# Patient Record
Sex: Female | Born: 1975 | Race: White | Hispanic: Yes | Marital: Single | Smoking: Never smoker
Health system: Southern US, Community
[De-identification: ages and names within clinical notes are randomized; demographics above are authoritative.]

## PROBLEM LIST (undated history)

## (undated) ENCOUNTER — Inpatient Hospital Stay (HOSPITAL_COMMUNITY): Payer: Self-pay

## (undated) DIAGNOSIS — K8020 Calculus of gallbladder without cholecystitis without obstruction: Secondary | ICD-10-CM

## (undated) DIAGNOSIS — N841 Polyp of cervix uteri: Secondary | ICD-10-CM

---

## 1999-07-14 ENCOUNTER — Emergency Department (HOSPITAL_COMMUNITY): Admission: EM | Admit: 1999-07-14 | Discharge: 1999-07-14 | Payer: Self-pay | Admitting: Internal Medicine

## 1999-07-14 ENCOUNTER — Encounter: Payer: Self-pay | Admitting: Emergency Medicine

## 1999-10-19 ENCOUNTER — Inpatient Hospital Stay (HOSPITAL_COMMUNITY): Admission: AD | Admit: 1999-10-19 | Discharge: 1999-10-22 | Payer: Self-pay | Admitting: *Deleted

## 1999-10-19 ENCOUNTER — Encounter (INDEPENDENT_AMBULATORY_CARE_PROVIDER_SITE_OTHER): Payer: Self-pay | Admitting: Specialist

## 2008-01-12 ENCOUNTER — Inpatient Hospital Stay (HOSPITAL_COMMUNITY): Admission: AD | Admit: 2008-01-12 | Discharge: 2008-01-14 | Payer: Self-pay | Admitting: Obstetrics

## 2008-01-12 ENCOUNTER — Encounter (INDEPENDENT_AMBULATORY_CARE_PROVIDER_SITE_OTHER): Payer: Self-pay | Admitting: Obstetrics

## 2010-07-28 NOTE — H&P (Signed)
NAMEJOSEPHINA, MELCHER             ACCOUNT NO.:  [id number]   MEDICAL RECORD NO.:  [id number]          PATIENT TYPE:  INP   LOCATION:  9164                          FACILITY:  [facility]   PHYSICIAN:  Roseanna Rainbow, M.D.DATE OF BIRTH:  1975-06-21   DATE OF ADMISSION:  01/12/2008  DATE OF DISCHARGE:                              HISTORY & PHYSICAL   CHIEF COMPLAINT:  The patient is a 35 year old para 2 with an estimated  date of confinement of January 07, 2008 with an intrauterine pregnancy  at 40 plus weeks complaining of contractions or rupture of membranes.   HISTORY OF PRESENT ILLNESS:  Please see the above.  The patient reports  rupture of membranes for several hours prior to presentation.   PAST GYN HISTORY:  Normal triad.   ALLERGIES:  No known drug allergies.   SOCIAL HISTORY:  She is married and unemployed.  She denies any tobacco,  ethanol, or drug use.   PAST OBSTETRICAL HISTORY:  In 1998, she was delivered of a live born  female in 8 pounds 3 ounces, full term vaginal delivery, no complications.  In 2001, she was delivered of a live born female 8 pounds, full term  vaginal delivery, no complications.   PAST MEDICAL HISTORY:  She denies.   PAST SURGICAL HISTORY:  She denies.   FAMILY HISTORY:  Noncontributory.   OB RISK FACTORS:  None.   PRENATAL LABS:  Hemoglobin 11.6, hematocrit 36.6, and platelets 238,000.  Blood type A+, antibody screen negative, RPR nonreactive, and rubella  immune.  Hepatitis B surface antigen negative, HIV nonreactive, and PPD  negative.  Pap smear negative.  GC and chlamydia probes negative.  One-  hour GTT 110.  GBS negative on December 13, 2007.  An ultrasound at 20  weeks 5 days, no previa.  Estimated confinement of January 07, 2008.   PHYSICAL EXAMINATION:  Vital signs stable, afebrile.  Fetal heart  tracing reassuring.  Tocodynamometer uterine contractions every 2-4  minutes.  Sterile vaginal exam per the RN.  The cervix is 5 cm  dilated.  There is gross rupture with a meconium-stained fluid.  Ultrasound  confirmed a cephalic presentation.   ASSESSMENT:  Multipara at term, late latent versus early active labor,  and meconium staining.  Fetal heart tracing consistent with fetal well  being.   PLAN:  Admission, expectant management for now.  Anticipate a  spontaneous vaginal delivery.      Roseanna Rainbow, M.D.  Electronically Signed     LAJ/MEDQ  D:  01/12/2008  T:  01/13/2008  Job:  [id number]   cc:   Kathreen Cosier, M.D.  Fax: [phone]

## 2010-12-14 LAB — CBC
HCT: 37
Hemoglobin: 11.8 — ABNORMAL LOW
Hemoglobin: 12.3
MCHC: 33.2
MCV: 92.1
Platelets: 216
RBC: 3.82 — ABNORMAL LOW
RBC: 4.02
RDW: 13.8
WBC: 12.2 — ABNORMAL HIGH
WBC: 9.1

## 2010-12-14 LAB — RPR: RPR Ser Ql: NONREACTIVE

## 2012-03-15 NOTE — L&D Delivery Note (Signed)
Delivery Note At 2:21 PM a viable female was delivered via Vaginal, Spontaneous Delivery (Presentation: ; Occiput Anterior).  APGAR: 9, 9; weight 8 lb 8.7 oz (3875 g).   Placenta status: Intact, Spontaneous.  Cord: 3 vessels with the following complications: None.  Cord pH: none  Anesthesia: None  Episiotomy: None Lacerations: 1st degree;Perineal Suture Repair: none Est. Blood Loss (mL): 250  Mom to postpartum.  Baby to nursery-stable.  HARPER,CHARLES A 10/22/2012, 3:17 PM

## 2012-04-08 ENCOUNTER — Inpatient Hospital Stay (HOSPITAL_COMMUNITY): Payer: Self-pay

## 2012-04-08 ENCOUNTER — Inpatient Hospital Stay (HOSPITAL_COMMUNITY)
Admission: AD | Admit: 2012-04-08 | Discharge: 2012-04-08 | Disposition: A | Discharge status: Home / Self Care | Payer: Self-pay | Source: Ambulatory Visit | Attending: Obstetrics and Gynecology | Admitting: Obstetrics and Gynecology

## 2012-04-08 ENCOUNTER — Encounter (HOSPITAL_COMMUNITY): Payer: Self-pay

## 2012-04-08 DIAGNOSIS — O2390 Unspecified genitourinary tract infection in pregnancy, unspecified trimester: Secondary | ICD-10-CM | POA: Insufficient documentation

## 2012-04-08 DIAGNOSIS — O2340 Unspecified infection of urinary tract in pregnancy, unspecified trimester: Secondary | ICD-10-CM

## 2012-04-08 DIAGNOSIS — M545 Low back pain, unspecified: Secondary | ICD-10-CM | POA: Insufficient documentation

## 2012-04-08 DIAGNOSIS — N390 Urinary tract infection, site not specified: Secondary | ICD-10-CM | POA: Insufficient documentation

## 2012-04-08 DIAGNOSIS — O209 Hemorrhage in early pregnancy, unspecified: Secondary | ICD-10-CM | POA: Insufficient documentation

## 2012-04-08 LAB — URINE MICROSCOPIC-ADD ON

## 2012-04-08 LAB — CBC
HCT: 35.7 % — ABNORMAL LOW (ref 36.0–46.0)
Hemoglobin: 12 g/dL (ref 12.0–15.0)
RDW: 12.5 % (ref 11.5–15.5)
WBC: 9.2 10*3/uL (ref 4.0–10.5)

## 2012-04-08 LAB — URINALYSIS, ROUTINE W REFLEX MICROSCOPIC
Glucose, UA: NEGATIVE mg/dL
Specific Gravity, Urine: >1.03 — ABNORMAL HIGH (ref 1.005–1.030)

## 2012-04-08 LAB — HCG, QUANTITATIVE, PREGNANCY: hCG, Beta Chain, Quant, S: 29715 m[IU]/mL — ABNORMAL HIGH (ref <5)

## 2012-04-08 LAB — POCT PREGNANCY, URINE: Preg Test, Ur: POSITIVE — AB

## 2012-04-08 MED ORDER — GI COCKTAIL ~~LOC~~
30.0000 mL | Freq: Once | ORAL | Status: AC
  Administered 2012-04-08: 30 mL via ORAL
  Filled 2012-04-08: qty 30

## 2012-04-08 MED ORDER — CEPHALEXIN 500 MG PO CAPS: 500.0000 mg | ORAL_CAPSULE | Freq: Three times a day (TID) | ORAL | Status: DC

## 2012-04-08 NOTE — MAU Provider Note (Signed)
History     CSN: [id number]  Arrival date and time: 04/08/12 2034   First Provider Initiated Contact with Patient 04/08/12 2115      Chief Complaint  Patient presents with  . Vaginal Bleeding  . Back Pain   HPI  Pt is a G4P3003 at 14 wks IUP by uncertain LMP.  Reports having lower back pain today and some vaginal bleeding. Experienced similar pain two wks ago .  +dysuria and hematuria. Today have seen some blood when wiping and unsure if coming from vagina.    History reviewed. No pertinent past medical history.  History reviewed. No pertinent past surgical history.  No family history on file.  History  Substance Use Topics  . Smoking status: Never Smoker   . Smokeless tobacco: Not on file  . Alcohol Use: No    Allergies: No Known Allergies  No prescriptions prior to admission    Review of Systems  Constitutional: Negative for fever.  Gastrointestinal: Positive for heartburn, nausea, vomiting and abdominal pain (lower pelvic).  Genitourinary: Positive for dysuria and hematuria. Negative for urgency, frequency and flank pain.       Vaginal bleeding  All other systems reviewed and are negative.   Physical Exam   Blood pressure 128/72, pulse 96, temperature 98.3 F (36.8 C), resp. rate 20, height 5\' 5"  (1.651 m), weight 96.072 kg (211 lb 12.8 oz), last menstrual period 12/29/2011.  Physical Exam  Constitutional: She is oriented to person, place, and time. She appears well-developed and well-nourished. No distress.  HENT:  Head: Normocephalic.  Neck: Normal range of motion. Neck supple.  Cardiovascular: Normal rate, regular rhythm and normal heart sounds.   Respiratory: Effort normal and breath sounds normal. No respiratory distress.  GI: Soft. She exhibits no mass. There is no tenderness. There is no rebound and no guarding.  Genitourinary: Uterus is enlarged. Right adnexum displays no mass, no tenderness and no fullness. Left adnexum displays no mass, no  tenderness and no fullness. No bleeding around the vagina.  Musculoskeletal: Normal range of motion.  Neurological: She is alert and oriented to person, place, and time.  Skin: Skin is warm and dry.    MAU Course  Procedures  Results for orders placed during the hospital encounter of 04/08/12 (from the past 24 hour(s))  URINALYSIS, ROUTINE W REFLEX MICROSCOPIC     Status: Abnormal   Collection Time   04/08/12  9:05 PM      Component Value Range   Color, Urine YELLOW  YELLOW   APPearance CLEAR  CLEAR   Specific Gravity, Urine >1.030 (*) 1.005 - 1.030   pH 5.5  5.0 - 8.0   Glucose, UA NEGATIVE  NEGATIVE mg/dL   Hgb urine dipstick LARGE (*) NEGATIVE   Bilirubin Urine NEGATIVE  NEGATIVE   Ketones, ur 40 (*) NEGATIVE mg/dL   Protein, ur NEGATIVE  NEGATIVE mg/dL   Urobilinogen, UA 0.2  0.0 - 1.0 mg/dL   Nitrite POSITIVE (*) NEGATIVE   Leukocytes, UA NEGATIVE  NEGATIVE  URINE MICROSCOPIC-ADD ON     Status: Abnormal   Collection Time   04/08/12  9:05 PM      Component Value Range   Squamous Epithelial / LPF FEW (*) RARE   WBC, UA 3-6  <3 WBC/hpf   RBC / HPF 21-50  <3 RBC/hpf   Bacteria, UA MANY (*) RARE   Urine-Other MUCOUS PRESENT    POCT PREGNANCY, URINE     Status: Abnormal   Collection Time  04/08/12  9:16 PM      Component Value Range   Preg Test, Ur POSITIVE (*) NEGATIVE  CBC     Status: Abnormal   Collection Time   04/08/12  9:44 PM      Component Value Range   WBC 9.2  4.0 - 10.5 K/uL   RBC 4.00  3.87 - 5.11 MIL/uL   Hemoglobin 12.0  12.0 - 15.0 g/dL   HCT 16.1 (*) 09.6 - 04.5 %   MCV 89.3  78.0 - 100.0 fL   MCH 30.0  26.0 - 34.0 pg   MCHC 33.6  30.0 - 36.0 g/dL   RDW 40.9  81.1 - 91.4 %   Platelets 233  150 - 400 K/uL  HCG, QUANTITATIVE, PREGNANCY     Status: Abnormal   Collection Time   04/08/12  9:44 PM      Component Value Range   hCG, Beta Chain, Quant, [place] [address] (*) <5 mIU/mL  ABO/RH     Status: Normal (Preliminary result)   Collection Time   04/08/12  9:44  PM      Component Value Range   ABO/RH(D) A POS     Ultrasound: IMPRESSION:  1. Single live intrauterine pregnancy noted, with a crown-rump  length of 5.3 cm, corresponding to a gestational age of [redacted] weeks 0  days. This does not match the gestational age of [redacted] weeks 3 days  by LMP, and reflects a new estimated date of delivery of October 21, 2012.  2. Small amount of subchorionic hemorrhage noted.  Assessment and Plan  12 wk IUP - Subchorionic Hemorrhage UTI  Plan: DC to home RX Keflex 500 TID x 7 days Bleeding Precautions Keep scheduled appt with Dr. Gaynell Face on 04/12/12  [facility] 04/08/2012, 9:18 PM

## 2012-04-08 NOTE — MAU Note (Signed)
Having lower back pain today and some vaginal bleeding. Two wks ago some pain when void and saw some blood in urine. Happened again yesterday. Today have seen some blood when wiping and unsure if coming from vagina.

## 2012-04-09 LAB — ABO/RH: ABO/RH(D): A POS

## 2012-04-09 NOTE — MAU Provider Note (Signed)
Attestation of Attending Supervision of Advanced Practitioner (CNM/NP): Evaluation and management procedures were performed by the Advanced Practitioner under my supervision and collaboration.  I have reviewed the Advanced Practitioner's note and chart, and I agree with the management and plan.  Lakendria Nicastro 04/09/2012 1:38 AM

## 2012-04-10 LAB — URINE CULTURE

## 2012-04-11 LAB — GC/CHLAMYDIA PROBE AMP: CT Probe RNA: NEGATIVE

## 2012-04-19 LAB — OB RESULTS CONSOLE RPR: RPR: NONREACTIVE

## 2012-04-19 LAB — OB RESULTS CONSOLE ANTIBODY SCREEN: Antibody Screen: NEGATIVE

## 2012-04-19 LAB — OB RESULTS CONSOLE ABO/RH: RH Type: POSITIVE

## 2012-09-21 LAB — OB RESULTS CONSOLE GC/CHLAMYDIA: Gonorrhea: NEGATIVE

## 2012-10-22 ENCOUNTER — Inpatient Hospital Stay (HOSPITAL_COMMUNITY)
Admission: AD | Admit: 2012-10-22 | Discharge: 2012-10-24 | DRG: 775 | Disposition: A | Discharge status: Home / Self Care | Payer: Medicaid Other | Source: Ambulatory Visit | Attending: Obstetrics | Admitting: Obstetrics

## 2012-10-22 ENCOUNTER — Encounter (HOSPITAL_COMMUNITY): Payer: Self-pay | Admitting: *Deleted

## 2012-10-22 DIAGNOSIS — O09529 Supervision of elderly multigravida, unspecified trimester: Secondary | ICD-10-CM | POA: Diagnosis present

## 2012-10-22 LAB — CBC
Hemoglobin: 11.6 g/dL — ABNORMAL LOW (ref 12.0–15.0)
RBC: 4.05 MIL/uL (ref 3.87–5.11)
WBC: 8.4 10*3/uL (ref 4.0–10.5)

## 2012-10-22 LAB — TYPE AND SCREEN
ABO/RH(D): A POS
Antibody Screen: NEGATIVE

## 2012-10-22 LAB — RPR: RPR Ser Ql: NONREACTIVE

## 2012-10-22 MED ORDER — LACTATED RINGERS IV SOLN: 500.0000 mL | INTRAVENOUS | Status: DC | PRN

## 2012-10-22 MED ORDER — ONDANSETRON HCL 4 MG/2ML IJ SOLN: 4.0000 mg | INTRAMUSCULAR | Status: DC | PRN

## 2012-10-22 MED ORDER — OXYTOCIN 40 UNITS IN LACTATED RINGERS INFUSION - SIMPLE MED: 62.5000 mL/h | INTRAVENOUS | Status: DC | PRN

## 2012-10-22 MED ORDER — TERBUTALINE SULFATE 1 MG/ML IJ SOLN: 0.2500 mg | Freq: Once | INTRAMUSCULAR | Status: DC | PRN

## 2012-10-22 MED ORDER — NALBUPHINE SYRINGE 5 MG/0.5 ML
5.0000 mg | INJECTION | INTRAMUSCULAR | Status: DC | PRN
  Administered 2012-10-22: 5 mg via INTRAVENOUS
  Filled 2012-10-22 (×2): qty 0.5

## 2012-10-22 MED ORDER — SIMETHICONE 80 MG PO CHEW: 80.0000 mg | CHEWABLE_TABLET | ORAL | Status: DC | PRN

## 2012-10-22 MED ORDER — LIDOCAINE HCL (PF) 1 % IJ SOLN
30.0000 mL | INTRAMUSCULAR | Status: DC | PRN
  Filled 2012-10-22: qty 30

## 2012-10-22 MED ORDER — CITRIC ACID-SODIUM CITRATE 334-500 MG/5ML PO SOLN: 30.0000 mL | ORAL | Status: DC | PRN

## 2012-10-22 MED ORDER — ONDANSETRON HCL 4 MG PO TABS: 4.0000 mg | ORAL_TABLET | ORAL | Status: DC | PRN

## 2012-10-22 MED ORDER — IBUPROFEN 600 MG PO TABS: 600.0000 mg | ORAL_TABLET | Freq: Four times a day (QID) | ORAL | Status: DC | PRN

## 2012-10-22 MED ORDER — ERYTHROMYCIN 5 MG/GM OP OINT: TOPICAL_OINTMENT | Freq: Once | OPHTHALMIC | Status: DC

## 2012-10-22 MED ORDER — NALBUPHINE SYRINGE 5 MG/0.5 ML
10.0000 mg | INJECTION | Freq: Four times a day (QID) | INTRAMUSCULAR | Status: DC | PRN
  Administered 2012-10-22: 10 mg via INTRAMUSCULAR
  Filled 2012-10-22 (×2): qty 1

## 2012-10-22 MED ORDER — DIBUCAINE 1 % RE OINT: 1.0000 "application " | TOPICAL_OINTMENT | RECTAL | Status: DC | PRN

## 2012-10-22 MED ORDER — PROMETHAZINE HCL 25 MG/ML IJ SOLN: 25.0000 mg | Freq: Four times a day (QID) | INTRAMUSCULAR | Status: DC | PRN

## 2012-10-22 MED ORDER — ONDANSETRON HCL 4 MG/2ML IJ SOLN
4.0000 mg | Freq: Four times a day (QID) | INTRAMUSCULAR | Status: DC | PRN
  Administered 2012-10-22: 4 mg via INTRAVENOUS
  Filled 2012-10-22: qty 2

## 2012-10-22 MED ORDER — ZOLPIDEM TARTRATE 5 MG PO TABS: 5.0000 mg | ORAL_TABLET | Freq: Every evening | ORAL | Status: DC | PRN

## 2012-10-22 MED ORDER — SENNOSIDES-DOCUSATE SODIUM 8.6-50 MG PO TABS
2.0000 | ORAL_TABLET | Freq: Every day | ORAL | Status: DC
  Administered 2012-10-22 – 2012-10-23 (×2): 2 via ORAL

## 2012-10-22 MED ORDER — LANOLIN HYDROUS EX OINT: TOPICAL_OINTMENT | CUTANEOUS | Status: DC | PRN

## 2012-10-22 MED ORDER — OXYCODONE-ACETAMINOPHEN 5-325 MG PO TABS: 1.0000 | ORAL_TABLET | ORAL | Status: DC | PRN

## 2012-10-22 MED ORDER — IBUPROFEN 600 MG PO TABS
600.0000 mg | ORAL_TABLET | Freq: Four times a day (QID) | ORAL | Status: DC
  Administered 2012-10-22 – 2012-10-24 (×7): 600 mg via ORAL
  Filled 2012-10-22 (×7): qty 1

## 2012-10-22 MED ORDER — LIDOCAINE HCL (PF) 1 % IJ SOLN
INTRAMUSCULAR | Status: AC
  Filled 2012-10-22: qty 30

## 2012-10-22 MED ORDER — BENZOCAINE-MENTHOL 20-0.5 % EX AERO: 1.0000 "application " | INHALATION_SPRAY | CUTANEOUS | Status: DC | PRN

## 2012-10-22 MED ORDER — OXYTOCIN 40 UNITS IN LACTATED RINGERS INFUSION - SIMPLE MED: 62.5000 mL/h | INTRAVENOUS | Status: DC

## 2012-10-22 MED ORDER — OXYTOCIN 10 UNIT/ML IJ SOLN
INTRAMUSCULAR | Status: AC
  Administered 2012-10-22: 10 [IU] via INTRAMUSCULAR
  Filled 2012-10-22: qty 1

## 2012-10-22 MED ORDER — TETANUS-DIPHTH-ACELL PERTUSSIS 5-2.5-18.5 LF-MCG/0.5 IM SUSP
0.5000 mL | Freq: Once | INTRAMUSCULAR | Status: AC
  Administered 2012-10-22: 0.5 mL via INTRAMUSCULAR

## 2012-10-22 MED ORDER — OXYTOCIN 40 UNITS IN LACTATED RINGERS INFUSION - SIMPLE MED
1.0000 m[IU]/min | INTRAVENOUS | Status: DC
  Administered 2012-10-22: 1 m[IU]/min via INTRAVENOUS
  Filled 2012-10-22: qty 1000

## 2012-10-22 MED ORDER — OXYTOCIN 40 UNITS IN LACTATED RINGERS INFUSION - SIMPLE MED
INTRAVENOUS | Status: AC
  Filled 2012-10-22: qty 1000

## 2012-10-22 MED ORDER — PRENATAL MULTIVITAMIN CH
1.0000 | ORAL_TABLET | Freq: Every day | ORAL | Status: DC
  Administered 2012-10-23: 1 via ORAL
  Filled 2012-10-22: qty 1

## 2012-10-22 MED ORDER — OXYTOCIN BOLUS FROM INFUSION: 500.0000 mL | INTRAVENOUS | Status: DC

## 2012-10-22 MED ORDER — OXYTOCIN 10 UNIT/ML IJ SOLN: 10.0000 [IU] | Freq: Once | INTRAMUSCULAR | Status: AC

## 2012-10-22 MED ORDER — DIPHENHYDRAMINE HCL 25 MG PO CAPS: 25.0000 mg | ORAL_CAPSULE | Freq: Four times a day (QID) | ORAL | Status: DC | PRN

## 2012-10-22 MED ORDER — ACETAMINOPHEN 325 MG PO TABS: 650.0000 mg | ORAL_TABLET | ORAL | Status: DC | PRN

## 2012-10-22 MED ORDER — LACTATED RINGERS IV SOLN
INTRAVENOUS | Status: DC
  Administered 2012-10-22: 14:00:00 via INTRAVENOUS

## 2012-10-22 MED ORDER — WITCH HAZEL-GLYCERIN EX PADS: 1.0000 "application " | MEDICATED_PAD | CUTANEOUS | Status: DC | PRN

## 2012-10-22 NOTE — Progress Notes (Signed)
Dr Clearance Coots notified of pt's VE, FHR tracing, ROM, and presentation. Orders received to have midlevel scan pt for presentation.

## 2012-10-22 NOTE — MAU Note (Signed)
Pt reports she has had ctx q 3-5 min Reports some bloody show and good fetal movement.

## 2012-10-22 NOTE — MAU Note (Signed)
D Poe in room with pt, ultrasound shows vertex presentation.

## 2012-10-22 NOTE — H&P (Signed)
Renee Espinoza is a 37 y.o. female presenting for UC's. Maternal Medical History:  Reason for admission: Contractions.  37 yo G4 P3.  EDC 10-21-12   Contractions: Frequency: regular.    Fetal activity: Perceived fetal activity is normal.    Prenatal complications: no prenatal complications Prenatal Complications - Diabetes: none.    OB History   Grav Para Term Preterm Abortions TAB SAB Ect Mult Living   4 3 3       3      History reviewed. No pertinent past medical history. History reviewed. No pertinent past surgical history. Family History: family history is not on file. Social History:  reports that she has never smoked. She does not have any smokeless tobacco history on file. She reports that she does not drink alcohol or use illicit drugs.   Prenatal Transfer Tool  Maternal Diabetes: No Genetic Screening: Normal Maternal Ultrasounds/Referrals: Normal Fetal Ultrasounds or other Referrals:  None Maternal Substance Abuse:  No Significant Maternal Medications:  None Significant Maternal Lab Results:  None Other Comments:  None  Review of Systems  All other systems reviewed and are negative.    Dilation: 4.5 Effacement (%): 80 Station: Ballotable Exam by:: G Morris RN Blood pressure 123/76, pulse 77, temperature 98.7 F (37.1 C), temperature source Oral, resp. rate 18, height 5' 4.5" (1.638 m), weight 222 lb 9.6 oz (100.971 kg), last menstrual period 12/29/2011. Maternal Exam:  Uterine Assessment: Contraction strength is moderate.  Abdomen: Patient reports no abdominal tenderness. Fetal presentation: vertex  Introitus: Normal vulva. Normal vagina.  Amniotic fluid character: clear.  Pelvis: adequate for delivery.   Cervix: Cervix evaluated by sterile speculum exam.     Physical Exam  Nursing note and vitals reviewed. Constitutional: She is oriented to person, place, and time. She appears well-developed and well-nourished.  HENT:  Head: Normocephalic and  atraumatic.  Eyes: Conjunctivae are normal. Pupils are equal, round, and reactive to light.  Neck: Normal range of motion. Neck supple.  Cardiovascular: Normal rate and regular rhythm.   Respiratory: Effort normal and breath sounds normal.  GI: Soft.  Genitourinary: Vagina normal and uterus normal.  Musculoskeletal: Normal range of motion.  Neurological: She is alert and oriented to person, place, and time.  Skin: Skin is warm and dry.  Psychiatric: She has a normal mood and affect. Her behavior is normal. Judgment and thought content normal.    Prenatal labs: ABO, Rh: --/--/A POS (01/25 2144) Antibody:   Rubella:   RPR:    HBsAg:    HIV:    GBS: Negative (07/17 0000)   Assessment/Plan: 40 weeks.  Active labor.  Expectant management.    HARPER,CHARLES A 10/22/2012, 9:55 AM

## 2012-10-23 LAB — CBC
HCT: 29.6 % — ABNORMAL LOW (ref 36.0–46.0)
MCV: 86.5 fL (ref 78.0–100.0)
RDW: 13.9 % (ref 11.5–15.5)
WBC: 10.1 10*3/uL (ref 4.0–10.5)

## 2012-10-23 NOTE — Progress Notes (Signed)
UR completed 

## 2012-10-23 NOTE — Progress Notes (Signed)
Patient ID: Renee Espinoza, female   DOB: [birth year], 37 y.o.   MRN: [id number] Postpartum day one Vital signs normal Fundus firm Lochia moderate Doing well

## 2012-10-24 NOTE — Discharge Summary (Signed)
Obstetric Discharge Summary Reason for Admission: onset of labor Prenatal Procedures: none Intrapartum Procedures: spontaneous vaginal delivery Postpartum Procedures: none Complications-Operative and Postpartum: none Hemoglobin  Date Value Range Status  10/23/2012 9.9* 12.0 - 15.0 g/dL Final     HCT  Date Value Range Status  10/23/2012 29.6* 36.0 - 46.0 % Final    Physical Exam:  General: alert Lochia: appropriate Uterine Fundus: firm Incision: healing well DVT Evaluation: No evidence of DVT seen on physical exam.  Discharge Diagnoses: Term Pregnancy-delivered  Discharge Information: Date: 10/24/2012 Activity: pelvic rest Diet: routine Medications: Percocet Condition: stable Instructions: refer to practice specific booklet Discharge to: home Follow-up Information   Follow up with Karmin Kasprzak A, MD. Schedule an appointment as soon as possible for a visit in 6 weeks.   Contact information:   [address] [place] [address] [phone]       Newborn Data: Live born female  Birth Weight: 8 lb 8.7 oz (3875 g) APGAR: 9, 9  Home with mother.  Renee Espinoza A 10/24/2012, 5:06 AM

## 2014-01-14 ENCOUNTER — Encounter (HOSPITAL_COMMUNITY): Payer: Self-pay | Admitting: *Deleted

## 2014-07-02 ENCOUNTER — Encounter (HOSPITAL_COMMUNITY): Payer: Self-pay | Admitting: Neurology

## 2014-07-02 ENCOUNTER — Emergency Department (HOSPITAL_COMMUNITY): Payer: Medicaid Other

## 2014-07-02 ENCOUNTER — Emergency Department (HOSPITAL_COMMUNITY)
Admission: EM | Admit: 2014-07-02 | Discharge: 2014-07-02 | Disposition: A | Discharge status: Home / Self Care | Payer: Medicaid Other | Attending: Emergency Medicine | Admitting: Emergency Medicine

## 2014-07-02 DIAGNOSIS — R1011 Right upper quadrant pain: Secondary | ICD-10-CM | POA: Diagnosis present

## 2014-07-02 DIAGNOSIS — Z3202 Encounter for pregnancy test, result negative: Secondary | ICD-10-CM | POA: Insufficient documentation

## 2014-07-02 DIAGNOSIS — R112 Nausea with vomiting, unspecified: Secondary | ICD-10-CM

## 2014-07-02 DIAGNOSIS — K8020 Calculus of gallbladder without cholecystitis without obstruction: Secondary | ICD-10-CM | POA: Diagnosis not present

## 2014-07-02 LAB — CBC WITH DIFFERENTIAL/PLATELET
BASOS ABS: 0 10*3/uL (ref 0.0–0.1)
BASOS PCT: 0 % (ref 0–1)
Eosinophils Absolute: 0.1 10*3/uL (ref 0.0–0.7)
Eosinophils Relative: 1 % (ref 0–5)
HEMATOCRIT: 39.5 % (ref 36.0–46.0)
HEMOGLOBIN: 13.1 g/dL (ref 12.0–15.0)
LYMPHS ABS: 2 10*3/uL (ref 0.7–4.0)
Lymphocytes Relative: 15 % (ref 12–46)
MCH: 29.5 pg (ref 26.0–34.0)
MCHC: 33.2 g/dL (ref 30.0–36.0)
MCV: 89 fL (ref 78.0–100.0)
MONO ABS: 0.7 10*3/uL (ref 0.1–1.0)
MONOS PCT: 6 % (ref 3–12)
NEUTROS ABS: 10 10*3/uL — AB (ref 1.7–7.7)
Neutrophils Relative %: 78 % — ABNORMAL HIGH (ref 43–77)
Platelets: 276 10*3/uL (ref 150–400)
RBC: 4.44 MIL/uL (ref 3.87–5.11)
RDW: 12.4 % (ref 11.5–15.5)
WBC: 12.8 10*3/uL — ABNORMAL HIGH (ref 4.0–10.5)

## 2014-07-02 LAB — COMPREHENSIVE METABOLIC PANEL
ALBUMIN: 3.8 g/dL (ref 3.5–5.2)
ALK PHOS: 76 U/L (ref 39–117)
ALT: 70 U/L — AB (ref 0–35)
ANION GAP: 9 (ref 5–15)
AST: 154 U/L — AB (ref 0–37)
BILIRUBIN TOTAL: 1.5 mg/dL — AB (ref 0.3–1.2)
BUN: 7 mg/dL (ref 6–23)
CHLORIDE: 106 mmol/L (ref 96–112)
CO2: 23 mmol/L (ref 19–32)
Calcium: 8.9 mg/dL (ref 8.4–10.5)
Creatinine, Ser: 0.62 mg/dL (ref 0.50–1.10)
GFR calc Af Amer: >90 mL/min (ref >90)
GFR calc non Af Amer: >90 mL/min (ref >90)
Glucose, Bld: 106 mg/dL — ABNORMAL HIGH (ref 70–99)
POTASSIUM: 3.7 mmol/L (ref 3.5–5.1)
SODIUM: 138 mmol/L (ref 135–145)
TOTAL PROTEIN: 7.9 g/dL (ref 6.0–8.3)

## 2014-07-02 LAB — URINALYSIS, ROUTINE W REFLEX MICROSCOPIC
BILIRUBIN URINE: NEGATIVE
Glucose, UA: NEGATIVE mg/dL
HGB URINE DIPSTICK: NEGATIVE
KETONES UR: 15 mg/dL — AB
Leukocytes, UA: NEGATIVE
NITRITE: NEGATIVE
PH: 8.5 — AB (ref 5.0–8.0)
Protein, ur: NEGATIVE mg/dL
SPECIFIC GRAVITY, URINE: 1.021 (ref 1.005–1.030)
Urobilinogen, UA: 1 mg/dL (ref 0.0–1.0)

## 2014-07-02 LAB — URINE MICROSCOPIC-ADD ON

## 2014-07-02 LAB — POC URINE PREG, ED: PREG TEST UR: NEGATIVE

## 2014-07-02 LAB — LIPASE, BLOOD: Lipase: 29 U/L (ref 11–59)

## 2014-07-02 MED ORDER — HYDROCODONE-ACETAMINOPHEN 5-325 MG PO TABS: 1.0000 | ORAL_TABLET | ORAL | Status: DC | PRN

## 2014-07-02 MED ORDER — SODIUM CHLORIDE 0.9 % IV BOLUS (SEPSIS)
1000.0000 mL | Freq: Once | INTRAVENOUS | Status: AC
Start: 2014-07-02 — End: 2014-07-02
  Administered 2014-07-02: 1000 mL via INTRAVENOUS

## 2014-07-02 MED ORDER — ONDANSETRON HCL 4 MG PO TABS: 4.0000 mg | ORAL_TABLET | Freq: Four times a day (QID) | ORAL | Status: DC

## 2014-07-02 MED ORDER — ONDANSETRON HCL 4 MG/2ML IJ SOLN
4.0000 mg | Freq: Once | INTRAMUSCULAR | Status: AC
  Administered 2014-07-02: 4 mg via INTRAVENOUS
  Filled 2014-07-02: qty 2

## 2014-07-02 MED ORDER — MORPHINE SULFATE 4 MG/ML IJ SOLN
4.0000 mg | Freq: Once | INTRAMUSCULAR | Status: AC
  Administered 2014-07-02: 4 mg via INTRAVENOUS
  Filled 2014-07-02: qty 1

## 2014-07-02 NOTE — ED Notes (Signed)
Pt reports pain in her stomach and in her back. Pt states that this pain comes and goes, first starting at 0300 this AM, but has become constant.

## 2014-07-02 NOTE — ED Notes (Signed)
Pt does NOT speak any english. Is here for pain in upper abd and back pain since last night. The pain went away then came back this afternoon. Reports vomiting x 3. Denies urinary problems.

## 2014-07-02 NOTE — ED Provider Notes (Signed)
CSN: [id number]     Arrival date & time 07/02/14  1626 History   First MD Initiated Contact with Patient 07/02/14 1832     Chief Complaint  Patient presents with  . Abdominal Pain     (Consider location/radiation/quality/duration/timing/severity/associated sxs/prior Treatment) Patient is a 39 y.o. female presenting with abdominal pain. The history is provided by the patient. The history is limited by a language barrier. A language interpreter was used.  Abdominal Pain Pain location:  RUQ and epigastric Pain quality comment:  Unable to describe Pain radiates to:  Back Duration:  2 days Timing:  Intermittent Progression:  Waxing and waning Chronicity:  New Context: eating   Context: not diet changes   Ineffective treatments:  Acetaminophen Risk factors: no alcohol abuse, has not had multiple surgeries, no NSAID use, not obese and not pregnant     Renee Espinoza is a 39 y.o. female presenting with right upper quadrant and epigastric abdominal pain that started last night and comes and goes. Patient states she has had 3 episodes of emesis. She has taken Tylenol for her symptoms vomited up. She denies any urinary symptoms.   History reviewed. No pertinent past medical history. History reviewed. No pertinent past surgical history. No family history on file. History  Substance Use Topics  . Smoking status: Never Smoker   . Smokeless tobacco: Never Used  . Alcohol Use: No   OB History    Gravida Para Term Preterm AB TAB SAB Ectopic Multiple Living   Review of Systems  Gastrointestinal: Positive for abdominal pain.      Allergies  Review of patient's allergies indicates no known allergies.  Home Medications   Prior to Admission medications   Medication Sig Start Date End Date Taking? Authorizing Provider  acetaminophen (TYLENOL) 500 MG tablet Take 500 mg by mouth every 6 (six) hours as needed for mild pain.   Yes Historical Provider, MD   HYDROcodone-acetaminophen (NORCO/VICODIN) 5-325 MG per tablet Take 1 tablet by mouth every 4 (four) hours as needed. 07/02/14   Oswaldo Conroy, PA-C  ondansetron (ZOFRAN) 4 MG tablet Take 1 tablet (4 mg total) by mouth every 6 (six) hours. 07/02/14   Oswaldo Conroy, PA-C   BP 107/68 mmHg  Pulse 92  Temp(Src) 97.4 F (36.3 C) (Oral)  Resp 17  SpO2 98%  LMP 07/01/2014 Physical Exam  Constitutional: She appears well-developed and well-nourished. No distress.  HENT:  Head: Normocephalic and atraumatic.  Mouth/Throat: Oropharynx is clear and moist.  Eyes: Conjunctivae and EOM are normal. Right eye exhibits no discharge. Left eye exhibits no discharge.  Cardiovascular: Normal rate and regular rhythm.   Pulmonary/Chest: Effort normal and breath sounds normal. No respiratory distress. She has no wheezes.  Abdominal: Soft. She exhibits no distension.  Hypoactive bowel sounds with right upper quadrant tenderness with positive Murphy's sign. Patient also with epigastric tenderness without rebound, rigidity, guarding. No CVA tenderness.  Neurological: She is alert. She exhibits normal muscle tone. Coordination normal.  Skin: Skin is warm and dry. She is not diaphoretic.  Nursing note and vitals reviewed.   ED Course  Procedures (including critical care time) Labs Review Labs Reviewed  CBC WITH DIFFERENTIAL/PLATELET - Abnormal; Notable for the following:    WBC 12.8 (*)    Neutrophils Relative % 78 (*)    Neutro Abs 10.0 (*)    All other components within normal limits  COMPREHENSIVE METABOLIC PANEL -  Abnormal; Notable for the following:    Glucose, Bld 106 (*)    AST 154 (*)    ALT 70 (*)    Total Bilirubin 1.5 (*)    All other components within normal limits  URINALYSIS, ROUTINE W REFLEX MICROSCOPIC - Abnormal; Notable for the following:    Color, Urine AMBER (*)    APPearance TURBID (*)    pH 8.5 (*)    Ketones, ur 15 (*)    All other components within normal limits  URINE  MICROSCOPIC-ADD ON - Abnormal; Notable for the following:    Squamous Epithelial / LPF FEW (*)    All other components within normal limits  LIPASE, BLOOD  POC URINE PREG, ED    Imaging Review Kore[place] Abdomen Complete  07/02/2014   CLINICAL DATA:  Abdominal pain.  Right upper quadrant tenderness.  EXAM: ULTRASOUND ABDOMEN COMPLETE  COMPARISON:  None.  FINDINGS: Gallbladder: The long dated but not over distended gallbladder containing multiple small mobile stones measuring up to 7 mm in size. Gallbladder wall was not thickened, measuring 2.4 mm. Sonographic Murphy's sign was negative.  Common bile duct: Diameter: 5 mm  Liver: No focal lesion identified. Within normal limits in parenchymal echogenicity.  IVC: No abnormality visualized.  Pancreas: Visualized portion unremarkable.  Spleen: Size and appearance within normal limits.  Right Kidney: Length: 11.4 cm. Echogenicity within normal limits. No mass or hydronephrosis visualized.  Left Kidney: Length: 11.0 cm. Echogenicity within normal limits. No mass or hydronephrosis visualized.  Abdominal aorta: No aneurysm visualized.  Other findings: None.  IMPRESSION: Cholelithiasis without evidence of acute cholecystitis. No biliary dilatation.   Electronically Signed   By: Sebastian AcheAllen  Grady   On: 07/02/2014 21:17     EKG Interpretation None      MDM   Final diagnoses:  Calculus of gallbladder without cholecystitis without obstruction  Non-intractable vomiting with nausea, vomiting of unspecified type   Patient presenting with one-day history of right upper quadrant and epigastric abdominal pain as well as associated emesis. No fevers or chills. VSS. Patient given fluids, Zofran, morphine with significant improvement of her pain. Abdomen tender right upper quadrant without evidence of peritonitis. Lab work significant for elevated liver function and bilirubin and ultrasound without evidence of cholecystitis with evidence of cholelithiasis. On repeat abdominal  exam no tenderness. Patient tolerated fluids in the ED without difficulty. Skin is comfortable though. Driving and sedation precautions provided. She's given referral to wellness center as well as general surgery for further management. Patient nontoxic nonseptic appearing and stable for discharge.  Discussed return precautions with patient. Discussed all results and patient verbalizes understanding and agrees with plan.  Case has been discussed with Dr. Criss AlvineGoldston who agrees with the above plan and to discharge.    Oswaldo ConroyVictoria Mallie Linnemann, PA-C 07/02/14 2237  Pricilla LovelessScott Goldston, MD 07/06/14 [phone]

## 2014-07-02 NOTE — Discharge Instructions (Signed)
Return to the emergency room with worsening of symptoms, new symptoms or with symptoms that are concerning , especially fevers, abdominal pain in one area, unable to keep down fluids, blood in stool or vomit, severe pain, you feel faint, lightheaded or pass out. Read below information and follow recommendations. Colelitiasis (Cholelithiasis) La colelitiasis (tambin llamada clculos en la vescula) es una enfermedad en la que se forman piedras en la vescula. La vescula es un rgano que almacena la bilis que se forma en el hgado y que ayuda a Engineer, agriculturaldigerir grasas. Los clculos comienzan como pequeos cristales y lentamente se transforman en piedras. El dolor en la vescula ocurre cuando se producen espasmos y los clculos obstruyen el conducto. El dolor tambin se produce cuando una piedra sale por el conducto.  FACTORES DE RIESGO  Ser mujer.   Tener embarazos mltiples. Algunas veces los mdicos aconsejan extirpar los clculos biliares antes de futuros embarazos.   Ser obeso.  Dietas que incluyan comidas fritas y grasas.   Ser mayor de 6760 aos y el aumento de la edad.   El uso prolongado de medicamentos que contengan hormonas femeninas.   Tener diabetes mellitus.   Prdida rpida de peso.   Historia familiar de clculos (herencia).  SNTOMAS  Nuseas.   Vmitos.  Dolor abdominal.   Piel amarilla (ictericia)   Dolor sbito. Puede persistir desde algunos minutos hasta algunas horas.  Grant RutsFiebre.   Sensibilidad al tacto. En algunos casos, cuando los clculos biliares no se mueven hacia el conducto biliar, las personas no sienten dolor ni presentan sntomas. Estos se denominan clculos "silenciosos".  TRATAMIENTO Los clculos silenciosos no requieren TEFL teachertratamiento. En los [place] Tool Workscasos graves, podr ser Bangladeshnecesaria una ciruga de urgencia. Las opciones de tratamiento son:  Kandis BanCiruga para extirpar la vescula. Es el tratamiento ms frecuente.  Medicamentos. No siempre dan resultado y  pueden demorar entre 6 y 12 meses o ms en Scientist, water qualityhacer efecto.  Tratamiento con ondas de choque (litotricia biliar extracorporal). En este tratamiento, una mquina de ultrasonido enva ondas de choque a la vescula para destruir los clculos en pequeos fragmentos que luego podrn pasar a los intestinos o ser disueltas con medicamentos. INSTRUCCIONES PARA EL CUIDADO EN EL HOGAR   Slo tome medicamentos de venta libre o recetados para Primary school teachercalmar el dolor, Environmental health practitionerel malestar o bajar la fiebre, segn las indicaciones de su mdico.   Siga una dieta baja en grasas hasta que su mdico lo vea nuevamente. Las grasas hacen que la vescula se Technical sales engineercontraiga, lo que puede Engineer, agriculturalproducir dolor.   Concurra a las consultas de control con su mdico segn las indicaciones. Los ataques casi siempre son recurrentes y generalmente habr que someterse a una ciruga como Williamsontratamiento permanente.  SOLICITE ATENCIN MDICA DE INMEDIATO SI:   El dolor aumenta y no puede controlarlo con los medicamentos.   Tiene fiebre o sntomas persistentes durante ms de 2 - 3 das.   Tiene fiebre y los sntomas empeoran repentinamente.   Tiene nuseas o vmitos persistentes.  ASEGRESE DE QUE:   Comprende estas instrucciones.  Controlar su afeccin.  Recibir ayuda de inmediato si no mejora o si empeora. Document Released: 12/16/2005 Document Revised: 11/01/2012 [facility] LLCExit[facility] Patient Information 2015 [employer], [place]. This information is not intended to replace advice given to you by your health care provider. Make sure you discuss any questions you have with your health care provider.

## 2014-07-03 ENCOUNTER — Inpatient Hospital Stay (HOSPITAL_COMMUNITY)
Admission: EM | Admit: 2014-07-03 | Discharge: 2014-07-06 | DRG: 419 | Disposition: A | Discharge status: Home / Self Care | Payer: Medicaid Other | Attending: General Surgery | Admitting: General Surgery

## 2014-07-03 ENCOUNTER — Encounter (HOSPITAL_COMMUNITY): Payer: Self-pay | Admitting: Emergency Medicine

## 2014-07-03 DIAGNOSIS — K8020 Calculus of gallbladder without cholecystitis without obstruction: Secondary | ICD-10-CM

## 2014-07-03 DIAGNOSIS — R748 Abnormal levels of other serum enzymes: Secondary | ICD-10-CM | POA: Diagnosis present

## 2014-07-03 DIAGNOSIS — K819 Cholecystitis, unspecified: Secondary | ICD-10-CM

## 2014-07-03 DIAGNOSIS — K8001 Calculus of gallbladder with acute cholecystitis with obstruction: Secondary | ICD-10-CM | POA: Diagnosis present

## 2014-07-03 DIAGNOSIS — K8042 Calculus of bile duct with acute cholecystitis without obstruction: Principal | ICD-10-CM | POA: Diagnosis present

## 2014-07-03 HISTORY — DX: Calculus of gallbladder without cholecystitis without obstruction: K80.20

## 2014-07-03 LAB — CBC WITH DIFFERENTIAL/PLATELET
Basophils Absolute: 0 10*3/uL (ref 0.0–0.1)
Basophils Relative: 0 % (ref 0–1)
EOS PCT: 2 % (ref 0–5)
Eosinophils Absolute: 0.2 10*3/uL (ref 0.0–0.7)
HCT: 39.2 % (ref 36.0–46.0)
Hemoglobin: 12.9 g/dL (ref 12.0–15.0)
Lymphocytes Relative: 21 % (ref 12–46)
Lymphs Abs: 1.7 10*3/uL (ref 0.7–4.0)
MCH: 29.5 pg (ref 26.0–34.0)
MCHC: 32.9 g/dL (ref 30.0–36.0)
MCV: 89.5 fL (ref 78.0–100.0)
Monocytes Absolute: 0.4 10*3/uL (ref 0.1–1.0)
Monocytes Relative: 4 % (ref 3–12)
NEUTROS ABS: 6 10*3/uL (ref 1.7–7.7)
NEUTROS PCT: 73 % (ref 43–77)
PLATELETS: 243 10*3/uL (ref 150–400)
RBC: 4.38 MIL/uL (ref 3.87–5.11)
RDW: 12.5 % (ref 11.5–15.5)
WBC: 8.2 10*3/uL (ref 4.0–10.5)

## 2014-07-03 LAB — POC URINE PREG, ED: Preg Test, Ur: NEGATIVE

## 2014-07-03 NOTE — ED Notes (Signed)
Pt took 1 2435m hydrocodone at 8:30 this evening

## 2014-07-03 NOTE — ED Notes (Signed)
Pt. reports persistent upper abdominal pain and mid back pan with emesis , seen here yesterday diagnosed with gallstones discharged home with prescriptions . Denies fever or chills.

## 2014-07-03 NOTE — ED Provider Notes (Signed)
CSN: [id number]     Arrival date & time 07/03/14  2236 History  This chart was scribed for Shon Baton, MD by Annye Asa, ED Scribe. This patient was seen in room A05C/A05C and the patient's care was started at 12:04 AM.    Chief Complaint  Patient presents with  . Cholelithiasis  . Abdominal Pain   Patient is a 39 y.o. female presenting with abdominal pain. The history is provided by the patient and a relative. No language interpreter was used (Adult daughter assisted with translation as needed).  Abdominal Pain Associated symptoms: nausea and vomiting   Associated symptoms: no chest pain, no cough, no diarrhea, no dysuria, no fever and no shortness of breath      HPI Comments: Renee Espinoza is a 39 y.o. female who presents to the Emergency Department complaining of recent RUQ pain, currently rated 5/10, and vomiting. Patient's daughter explains she was seen in [facility] ED last night for the same symptoms and was diagnosed with cholelithiasis; she was discharged home with Norco. Patient states she took the medication as prescribed today with transient relief; per nurse's note, last dose  hydrocodone at 20:30 this evening.. Her pain continued tonight, prompting her to return to the ED. Patient states she has not eaten today; she is unsure if her pain is exacerbated with eating. She denies fevers, diarrhea.   Past Medical History  Diagnosis Date  . Gallstones    History reviewed. No pertinent past surgical history. No family history on file. History  Substance Use Topics  . Smoking status: Never Smoker   . Smokeless tobacco: Never Used  . Alcohol Use: No   OB History    Gravida Para Term Preterm AB TAB SAB Ectopic Multiple Living   Review of Systems  Constitutional: Negative for fever.  Respiratory: Negative for cough, chest tightness and shortness of breath.   Cardiovascular: Negative for chest pain.  Gastrointestinal: Positive for nausea, vomiting and  abdominal pain. Negative for diarrhea.  Genitourinary: Negative for dysuria.  Musculoskeletal: Negative for back pain.  Neurological: Negative for headaches.  Psychiatric/Behavioral: Negative for confusion.  All other systems reviewed and are negative.  Allergies  Review of patient's allergies indicates no known allergies.  Home Medications   Prior to Admission medications   Medication Sig Start Date End Date Taking? Authorizing Provider  acetaminophen (TYLENOL) 500 MG tablet Take 500 mg by mouth every 6 (six) hours as needed for mild pain.    Historical Provider, MD  HYDROcodone-acetaminophen (NORCO/VICODIN) 5-325 MG per tablet Take 1 tablet by mouth every 4 (four) hours as needed. 07/02/14   Oswaldo Conroy, PA-C  ondansetron (ZOFRAN) 4 MG tablet Take 1 tablet (4 mg total) by mouth every 6 (six) hours. 07/02/14   Oswaldo Conroy, PA-C   BP 107/75 mmHg  Pulse 83  Temp(Src) 98.5 F (36.9 C) (Oral)  Resp 16  Ht  (1.651 m)  Wt 220 lb (99.791 kg)  BMI 36.61 kg/m2  SpO2 96%  LMP 06/26/2014 Physical Exam  Constitutional: She is oriented to person, place, and time. She appears well-developed and well-nourished.  Overweight  HENT:  Head: Normocephalic and atraumatic.  Cardiovascular: Normal rate, regular rhythm and normal heart sounds.   No murmur heard. Pulmonary/Chest: Effort normal and breath sounds normal. No respiratory distress. She has no wheezes.  Abdominal: Soft. Bowel sounds are normal. There is tenderness. There is no rebound and no  guarding.  Right upper quadrant tenderness to palpation without rebound or guarding  Neurological: She is alert and oriented to person, place, and time.  Skin: Skin is warm and dry.  Psychiatric: She has a normal mood and affect.  Nursing note and vitals reviewed.   ED Course  Procedures   DIAGNOSTIC STUDIES: Oxygen Saturation is 97% on RA, adequate by my interpretation.    COORDINATION OF CARE: 12:08 AM Discussed treatment plan  with pt at bedside and pt agreed to plan.   Labs Reviewed  URINALYSIS, ROUTINE W REFLEX MICROSCOPIC - Abnormal; Notable for the following:    Color, Urine AMBER (*)    Bilirubin Urine SMALL (*)    Ketones, ur 40 (*)    Leukocytes, UA TRACE (*)    All other components within normal limits  COMPREHENSIVE METABOLIC PANEL - Abnormal; Notable for the following:    BUN <5 (*)    AST 196 (*)    ALT 224 (*)    Total Bilirubin 5.4 (*)    All other components within normal limits  URINE MICROSCOPIC-ADD ON - Abnormal; Notable for the following:    Squamous Epithelial / LPF MANY (*)    Bacteria, UA FEW (*)    All other components within normal limits  CBC WITH DIFFERENTIAL/PLATELET  LIPASE, BLOOD  CBC  CREATININE, SERUM  CBC  COMPREHENSIVE METABOLIC PANEL  POC URINE PREG, ED    Imaging Review [place] Abdomen Complete  07/02/2014   CLINICAL DATA:  Abdominal pain.  Right upper quadrant tenderness.  EXAM: ULTRASOUND ABDOMEN COMPLETE  COMPARISON:  None.  FINDINGS: Gallbladder: The long dated but not over distended gallbladder containing multiple small mobile stones measuring up to 7 mm in size. Gallbladder wall was not thickened, measuring 2.4 mm. Sonographic Murphy's sign was negative.  Common bile duct: Diameter: 5 mm  Liver: No focal lesion identified. Within normal limits in parenchymal echogenicity.  IVC: No abnormality visualized.  Pancreas: Visualized portion unremarkable.  Spleen: Size and appearance within normal limits.  Right Kidney: Length: 11.4 cm. Echogenicity within normal limits. No mass or hydronephrosis visualized.  Left Kidney: Length: 11.0 cm. Echogenicity within normal limits. No mass or hydronephrosis visualized.  Abdominal aorta: No aneurysm visualized.  Other findings: None.  IMPRESSION: Cholelithiasis without evidence of acute cholecystitis. No biliary dilatation.   Electronically Signed   By: Sebastian AcheAllen  Grady   On: 07/02/2014 21:17     EKG Interpretation None      MDM    Final diagnoses:  Cholecystitis   Patient presents with continued right upper quadrant abdominal pain. Diagnosed with cholecystitis yesterday. Nontoxic-appearing on exam. No signs of peritonitis but is tender to palpation of the right upper quadrant. Lab work obtained in triage shows increased LFTs when compared to yesterday. AST is 196, ALT 224, and T bili is now 5.4. Patient made nothing by mouth. Patient given pain and nausea medication. General surgery consulted.  I personally performed the services described in this documentation, which was scribed in my presence. The recorded information has been reviewed and is accurate.      Shon Batonourtney F Shon Mansouri, MD 07/04/14 [phone]

## 2014-07-04 ENCOUNTER — Inpatient Hospital Stay (HOSPITAL_COMMUNITY): Payer: Medicaid Other

## 2014-07-04 DIAGNOSIS — R1011 Right upper quadrant pain: Secondary | ICD-10-CM | POA: Diagnosis present

## 2014-07-04 DIAGNOSIS — R748 Abnormal levels of other serum enzymes: Secondary | ICD-10-CM | POA: Diagnosis present

## 2014-07-04 DIAGNOSIS — K8042 Calculus of bile duct with acute cholecystitis without obstruction: Secondary | ICD-10-CM | POA: Diagnosis present

## 2014-07-04 DIAGNOSIS — K8001 Calculus of gallbladder with acute cholecystitis with obstruction: Secondary | ICD-10-CM | POA: Diagnosis present

## 2014-07-04 LAB — COMPREHENSIVE METABOLIC PANEL
ALT: 224 U/L — ABNORMAL HIGH (ref 0–35)
ALT: 209 U/L — ABNORMAL HIGH (ref 0–35)
ANION GAP: 9 (ref 5–15)
AST: 196 U/L — ABNORMAL HIGH (ref 0–37)
AST: 172 U/L — ABNORMAL HIGH (ref 0–37)
Albumin: 3.8 g/dL (ref 3.5–5.2)
Albumin: 3.5 g/dL (ref 3.5–5.2)
Alkaline Phosphatase: 103 U/L (ref 39–117)
Alkaline Phosphatase: 105 U/L (ref 39–117)
Anion gap: 8 (ref 5–15)
BILIRUBIN TOTAL: 5.4 mg/dL — AB (ref 0.3–1.2)
BILIRUBIN TOTAL: 5.4 mg/dL — AB (ref 0.3–1.2)
CHLORIDE: 105 mmol/L (ref 96–112)
CHLORIDE: 108 mmol/L (ref 96–112)
CO2: 23 mmol/L (ref 19–32)
CO2: 23 mmol/L (ref 19–32)
CREATININE: 0.76 mg/dL (ref 0.50–1.10)
CREATININE: 0.69 mg/dL (ref 0.50–1.10)
Calcium: 8.6 mg/dL (ref 8.4–10.5)
Calcium: 8.4 mg/dL (ref 8.4–10.5)
GFR calc non Af Amer: >90 mL/min (ref >90)
GLUCOSE: 97 mg/dL (ref 70–99)
Glucose, Bld: 107 mg/dL — ABNORMAL HIGH (ref 70–99)
Potassium: 3.7 mmol/L (ref 3.5–5.1)
Potassium: 3.8 mmol/L (ref 3.5–5.1)
SODIUM: 137 mmol/L (ref 135–145)
Sodium: 139 mmol/L (ref 135–145)
TOTAL PROTEIN: 6.4 g/dL (ref 6.0–8.3)
Total Protein: 7.2 g/dL (ref 6.0–8.3)

## 2014-07-04 LAB — CBC
HCT: 38.9 % (ref 36.0–46.0)
HEMOGLOBIN: 12.6 g/dL (ref 12.0–15.0)
MCH: 29.2 pg (ref 26.0–34.0)
MCHC: 32.4 g/dL (ref 30.0–36.0)
MCV: 90 fL (ref 78.0–100.0)
PLATELETS: 230 10*3/uL (ref 150–400)
RBC: 4.32 MIL/uL (ref 3.87–5.11)
RDW: 12.7 % (ref 11.5–15.5)
WBC: 5.9 10*3/uL (ref 4.0–10.5)

## 2014-07-04 LAB — URINALYSIS, ROUTINE W REFLEX MICROSCOPIC
GLUCOSE, UA: NEGATIVE mg/dL
Hgb urine dipstick: NEGATIVE
KETONES UR: 40 mg/dL — AB
NITRITE: NEGATIVE
PH: 6 (ref 5.0–8.0)
PROTEIN: NEGATIVE mg/dL
SPECIFIC GRAVITY, URINE: 1.01 (ref 1.005–1.030)
Urobilinogen, UA: 0.2 mg/dL (ref 0.0–1.0)

## 2014-07-04 LAB — SURGICAL PCR SCREEN
MRSA, PCR: NEGATIVE
Staphylococcus aureus: NEGATIVE

## 2014-07-04 LAB — URINE MICROSCOPIC-ADD ON

## 2014-07-04 LAB — LIPASE, BLOOD: LIPASE: 29 U/L (ref 11–59)

## 2014-07-04 MED ORDER — HYDROMORPHONE HCL 1 MG/ML IJ SOLN
1.0000 mg | INTRAMUSCULAR | Status: DC | PRN
  Administered 2014-07-05 – 2014-07-06 (×4): 1 mg via INTRAVENOUS
  Filled 2014-07-04 (×4): qty 1

## 2014-07-04 MED ORDER — MORPHINE SULFATE 4 MG/ML IJ SOLN
4.0000 mg | Freq: Once | INTRAMUSCULAR | Status: AC
  Administered 2014-07-04: 4 mg via INTRAVENOUS
  Filled 2014-07-04: qty 1

## 2014-07-04 MED ORDER — ONDANSETRON HCL 4 MG/2ML IJ SOLN
4.0000 mg | Freq: Four times a day (QID) | INTRAMUSCULAR | Status: DC | PRN
  Administered 2014-07-04 – 2014-07-06 (×2): 4 mg via INTRAVENOUS
  Filled 2014-07-04 (×2): qty 2

## 2014-07-04 MED ORDER — KCL IN DEXTROSE-NACL 20-5-0.9 MEQ/L-%-% IV SOLN
INTRAVENOUS | Status: DC
  Administered 2014-07-04 – 2014-07-06 (×5): via INTRAVENOUS
  Filled 2014-07-04 (×8): qty 1000

## 2014-07-04 MED ORDER — ONDANSETRON HCL 4 MG/2ML IJ SOLN
4.0000 mg | Freq: Once | INTRAMUSCULAR | Status: AC
  Administered 2014-07-04: 4 mg via INTRAVENOUS
  Filled 2014-07-04: qty 2

## 2014-07-04 MED ORDER — ENOXAPARIN SODIUM 40 MG/0.4ML ~~LOC~~ SOLN
40.0000 mg | Freq: Every day | SUBCUTANEOUS | Status: DC
  Administered 2014-07-04: 40 mg via SUBCUTANEOUS
  Filled 2014-07-04: qty 0.4

## 2014-07-04 NOTE — Progress Notes (Signed)
Patient ID: Renee Espinoza, female   DOB: 04/05/1975, 39 y.o.   MRN: [id number]    Subjective: Pt feels well this morning.  No further pain at this time  Objective: Vital signs in last 24 hours: Temp:  [98.5 F (36.9 C)-98.7 F (37.1 C)] 98.7 F (37.1 C) (04/21 0658) Pulse Rate:  [72-86] 86 (04/21 0658) Resp:  [12-19] 16 (04/21 0658) BP: (100-120)/(63-83) 102/83 mmHg (04/21 0658) SpO2:  [96 %-100 %] 100 % (04/21 0658) Weight:  [99.791 kg (220 lb)] 99.791 kg (220 lb) (04/21 0155) Last BM Date: 07/03/14  Intake/Output from previous day: 04/20 0701 - 04/21 0700 In: 428.3 [I.V.:428.3] Out: -  Intake/Output this shift:    PE: Abd: soft, NT, ND, +BS Heart: regular Lungs: CTAB  Lab Results:   Recent Labs  07/03/14 2252 07/04/14 0724  WBC 8.2 5.9  HGB 12.9 12.6  HCT 39.2 38.9  PLT 243 230   BMET  Recent Labs  07/03/14 2252 07/04/14 0724  NA 137 139  K 3.7 3.8  CL 105 108  CO2 23 23  GLUCOSE 97 107*  BUN <5* <5*  CREATININE 0.76 0.69  CALCIUM 8.6 8.4   PT/INR No results for input(s): LABPROT, INR in the last 72 hours. CMP     Component Value Date/Time   NA 139 07/04/2014 0724   K 3.8 07/04/2014 0724   CL 108 07/04/2014 0724   CO2 23 07/04/2014 0724   GLUCOSE 107* 07/04/2014 0724   BUN <5* 07/04/2014 0724   CREATININE 0.69 07/04/2014 0724   CALCIUM 8.4 07/04/2014 0724   PROT 6.4 07/04/2014 0724   ALBUMIN 3.5 07/04/2014 0724   AST 172* 07/04/2014 0724   ALT 209* 07/04/2014 0724   ALKPHOS 105 07/04/2014 0724   BILITOT 5.4* 07/04/2014 0724   GFRNONAA >90 07/04/2014 0724   GFRAA >90 07/04/2014 0724   Lipase     Component Value Date/Time   LIPASE 29 07/03/2014 2252       Studies/Results: [place] Abdomen Complete  07/02/2014   CLINICAL DATA:  Abdominal pain.  Right upper quadrant tenderness.  EXAM: ULTRASOUND ABDOMEN COMPLETE  COMPARISON:  None.  FINDINGS: Gallbladder: The long dated but not over distended gallbladder containing multiple small mobile  stones measuring up to 7 mm in size. Gallbladder wall was not thickened, measuring 2.4 mm. Sonographic Murphy's sign was negative.  Common bile duct: Diameter: 5 mm  Liver: No focal lesion identified. Within normal limits in parenchymal echogenicity.  IVC: No abnormality visualized.  Pancreas: Visualized portion unremarkable.  Spleen: Size and appearance within normal limits.  Right Kidney: Length: 11.4 cm. Echogenicity within normal limits. No mass or hydronephrosis visualized.  Left Kidney: Length: 11.0 cm. Echogenicity within normal limits. No mass or hydronephrosis visualized.  Abdominal aorta: No aneurysm visualized.  Other findings: None.  IMPRESSION: Cholelithiasis without evidence of acute cholecystitis. No biliary dilatation.   Electronically Signed   By: Sebastian AcheAllen  Grady   On: 07/02/2014 21:17   [place] Abdomen Limited Ruq  07/04/2014   CLINICAL DATA:  Abdominal pain  EXAM: US ABDOMEN LIMITED - RIGHT UPPER QUADRANT  COMPARISON:  07/02/2014  FINDINGS: Gallbladder:  Cholelithiasis. Gallbladder wall thickness at 2.5 mm. No pericholecystic fluid. Negative sonographic Murphy sign.  Common bile duct:  Diameter: 6 mm, within normal limits.  Liver:  Mild diffusely increased in echogenicity. No focal lesion identified.  IMPRESSION: Cholelithiasis without sonographic evidence of acute cholecystitis. Recommend HIDA scan if clinical concern persists.  Mild hepatic steatosis.   Electronically  Signed   By: Jearld Lesch M.D.   On: 07/04/2014 02:33    Anti-infectives: Anti-infectives    None       Assessment/Plan  1. Biliary colic/? Choledocholithiasis, hyperbilirubinemia -TB is still 5.4 today.  She no longer has pain this morning.  Her bili may be plateauing, but will have GI consult and evaluate her need for pre-operative ERCP. -cont NPO for now in case she needs a procedure today -will plan lap chole this admission, timing pending other possible procedures   LOS: 0 days    Briceson Broadwater E 07/04/2014,  9:14 AM Pager: [phone]

## 2014-07-04 NOTE — Consult Note (Signed)
Unassigned Consult  Reason for Consult: Elevated TB and gallstones Referring Physician: CCS  [name] HPI: This is a 39 year old female who represents with RUQ pain.  A repeat ultrasound reveals stones, but no evidence of any cholecystitis.  Her CBD is also measured to be 5-6 mm, however, her liver enzymes are elevated as well as her TB.  Over the past couple of days her liver enzymes appear to be dropping.    Past Medical History  Diagnosis Date  . Gallstones     History reviewed. No pertinent past surgical history.  No family history on file.  Social History:  reports that she has never smoked. She has never used smokeless tobacco. She reports that she does not drink alcohol or use illicit drugs.  Allergies: No Known Allergies  Medications:  Scheduled: . enoxaparin (LOVENOX) injection  40 mg Subcutaneous Daily   Continuous: . dextrose 5 % and 0.9 % NaCl with KCl 20 mEq/L 100 mL/hr at 07/04/14 1421    Results for orders placed or performed during the hospital encounter of 07/03/14 (from the past 24 hour(s))  Urinalysis, Routine w reflex microscopic     Status: Abnormal   Collection Time: 07/03/14 10:52 PM  Result Value Ref Range   Color, Urine AMBER (A) YELLOW   APPearance CLEAR CLEAR   Specific Gravity, Urine 1.010 1.005 - 1.030   pH 6.0 5.0 - 8.0   Glucose, UA NEGATIVE NEGATIVE mg/dL   Hgb urine dipstick NEGATIVE NEGATIVE   Bilirubin Urine SMALL (A) NEGATIVE   Ketones, ur 40 (A) NEGATIVE mg/dL   Protein, ur NEGATIVE NEGATIVE mg/dL   Urobilinogen, UA 0.2 0.0 - 1.0 mg/dL   Nitrite NEGATIVE NEGATIVE   Leukocytes, UA TRACE (A) NEGATIVE  CBC with Differential     Status: None   Collection Time: 07/03/14 10:52 PM  Result Value Ref Range   WBC 8.2 4.0 - 10.5 K/uL   RBC 4.38 3.87 - 5.11 MIL/uL   Hemoglobin 12.9 12.0 - 15.0 g/dL   HCT 16.1 09.6 - 04.5 %   MCV 89.5 78.0 - 100.0 fL   MCH 29.5 26.0 - 34.0 pg   MCHC 32.9 30.0 - 36.0 g/dL   RDW 40.9 81.1 - 91.4 %   Platelets 243 150 - 400 K/uL   Neutrophils Relative % 73 43 - 77 %   Neutro Abs 6.0 1.7 - 7.7 K/uL   Lymphocytes Relative 21 12 - 46 %   Lymphs Abs 1.7 0.7 - 4.0 K/uL   Monocytes Relative 4 3 - 12 %   Monocytes Absolute 0.4 0.1 - 1.0 K/uL   Eosinophils Relative 2 0 - 5 %   Eosinophils Absolute 0.2 0.0 - 0.7 K/uL   Basophils Relative 0 0 - 1 %   Basophils Absolute 0.0 0.0 - 0.1 K/uL  Comprehensive metabolic panel     Status: Abnormal   Collection Time: 07/03/14 10:52 PM  Result Value Ref Range   Sodium 137 135 - 145 mmol/L   Potassium 3.7 3.5 - 5.1 mmol/L   Chloride 105 96 - 112 mmol/L   CO2 23 19 - 32 mmol/L   Glucose, Bld 97 70 - 99 mg/dL   BUN <5 (L) 6 - 23 mg/dL   Creatinine, Ser 7.82 0.50 - 1.10 mg/dL   Calcium 8.6 8.4 - 95.6 mg/dL   Total Protein 7.2 6.0 - 8.3 g/dL   Albumin 3.8 3.5 - 5.2 g/dL   AST 213 (H) 0 - 37 U/L  ALT 224 (H) 0 - 35 U/L   Alkaline Phosphatase 103 39 - 117 U/L   Total Bilirubin 5.4 (H) 0.3 - 1.2 mg/dL   GFR calc non Af Amer >90 >90 mL/min   GFR calc Af Amer >90 >90 mL/min   Anion gap 9 5 - 15  Lipase, blood     Status: None   Collection Time: 07/03/14 10:52 PM  Result Value Ref Range   Lipase 29 11 - 59 U/L  Urine microscopic-add on     Status: Abnormal   Collection Time: 07/03/14 10:52 PM  Result Value Ref Range   Squamous Epithelial / LPF MANY (A) RARE   WBC, UA 3-6 <3 WBC/hpf   Bacteria, UA FEW (A) RARE  POC Urine Pregnancy, ED ([facility])     Status: None   Collection Time: 07/03/14 11:02 PM  Result Value Ref Range   Preg Test, Ur NEGATIVE NEGATIVE  Surgical pcr screen     Status: None   Collection Time: 07/04/14  2:50 AM  Result Value Ref Range   MRSA, PCR NEGATIVE NEGATIVE   Staphylococcus aureus NEGATIVE NEGATIVE  CBC     Status: None   Collection Time: 07/04/14  7:24 AM  Result Value Ref Range   WBC 5.9 4.0 - 10.5 K/uL   RBC 4.32 3.87 - 5.11 MIL/uL   Hemoglobin 12.6 12.0 - 15.0 g/dL   HCT 16.138.9 09.636.0 - 04.546.0 %   MCV  90.0 78.0 - 100.0 fL   MCH 29.2 26.0 - 34.0 pg   MCHC 32.4 30.0 - 36.0 g/dL   RDW 40.912.7 81.111.5 - 91.415.5 %   Platelets 230 150 - 400 K/uL  Comprehensive metabolic panel     Status: Abnormal   Collection Time: 07/04/14  7:24 AM  Result Value Ref Range   Sodium 139 135 - 145 mmol/L   Potassium 3.8 3.5 - 5.1 mmol/L   Chloride 108 96 - 112 mmol/L   CO2 23 19 - 32 mmol/L   Glucose, Bld 107 (H) 70 - 99 mg/dL   BUN <5 (L) 6 - 23 mg/dL   Creatinine, Ser 7.820.69 0.50 - 1.10 mg/dL   Calcium 8.4 8.4 - 95.610.5 mg/dL   Total Protein 6.4 6.0 - 8.3 g/dL   Albumin 3.5 3.5 - 5.2 g/dL   AST 213172 (H) 0 - 37 U/L   ALT 209 (H) 0 - 35 U/L   Alkaline Phosphatase 105 39 - 117 U/L   Total Bilirubin 5.4 (H) 0.3 - 1.2 mg/dL   GFR calc non Af Amer >90 >90 mL/min   GFR calc Af Amer >90 >90 mL/min   Anion gap 8 5 - 15     [place] Abdomen Complete  07/02/2014   CLINICAL DATA:  Abdominal pain.  Right upper quadrant tenderness.  EXAM: ULTRASOUND ABDOMEN COMPLETE  COMPARISON:  None.  FINDINGS: Gallbladder: The long dated but not over distended gallbladder containing multiple small mobile stones measuring up to 7 mm in size. Gallbladder wall was not thickened, measuring 2.4 mm. Sonographic Murphy's sign was negative.  Common bile duct: Diameter: 5 mm  Liver: No focal lesion identified. Within normal limits in parenchymal echogenicity.  IVC: No abnormality visualized.  Pancreas: Visualized portion unremarkable.  Spleen: Size and appearance within normal limits.  Right Kidney: Length: 11.4 cm. Echogenicity within normal limits. No mass or hydronephrosis visualized.  Left Kidney: Length: 11.0 cm. Echogenicity within normal limits. No mass or hydronephrosis visualized.  Abdominal aorta: No  aneurysm visualized.  Other findings: None.  IMPRESSION: Cholelithiasis without evidence of acute cholecystitis. No biliary dilatation.   Electronically Signed   By: Sebastian Ache   On: 07/02/2014 21:17   US Abdomen Limited Ruq  07/04/2014   CLINICAL DATA:   Abdominal pain  EXAM: US ABDOMEN LIMITED - RIGHT UPPER QUADRANT  COMPARISON:  07/02/2014  FINDINGS: Gallbladder:  Cholelithiasis. Gallbladder wall thickness at 2.5 mm. No pericholecystic fluid. Negative sonographic Murphy sign.  Common bile duct:  Diameter: 6 mm, within normal limits.  Liver:  Mild diffusely increased in echogenicity. No focal lesion identified.  IMPRESSION: Cholelithiasis without sonographic evidence of acute cholecystitis. Recommend HIDA scan if clinical concern persists.  Mild hepatic steatosis.   Electronically Signed   By: Jearld Lesch M.D.   On: 07/04/2014 02:33    ROS:  As stated above in the HPI otherwise negative.  Blood pressure 116/82, pulse 90, temperature 97.8 F (36.6 C), temperature source Oral, resp. rate 16, height  (1.651 m), weight 99.791 kg (220 lb), last menstrual period 06/26/2014, SpO2 97 %, unknown if currently breastfeeding.    PE: Gen: NAD, Alert and Oriented HEENT:  [place]/AT, EOMI Neck: Supple, no LAD Lungs: CTA Bilaterally CV: RRR without M/G/R ABM: Soft, NTND, +BS Ext: No C/C/E  Assessment/Plan: 1) Cholelithiasis. 2) Hyperbilirubinemia. 3) Abnormal liver enzymes.   By all accounts the patient appears to have a normal CBD, but the elevated liver enzymes are concerning for small stones in the CBD.  In other cases similar to this patient I have noted that the IOC reveals stones.  It will be prudent to perform further evaluation with an EUS +/- ERCP.  Even though her English is limited, she was able to understand the plan for the procedures and subsequent surgery.  Plan: 1) EUS +/- ERCP tomorrow.  Marigene Erler D 07/04/2014, 2:46 PM

## 2014-07-04 NOTE — H&P (Signed)
Vidhi Delellis is an 39 y.o. female.   Chief Complaint: RUQ abdominal pain for 1 day HPI: asked to see patient for RUQ abdominal pain 1 day. Pt speaks no english but has daughter translate.  Seen yesterday in ED with same sxs.  Had U/S and labd which showed stone in GB but no inflammation and CBD 5 mm.  Now has total bilirubin of 5.4 and worsening RUQ pain constant ache like.  Better with pain meds. No vomiting currently.  Past Medical History  Diagnosis Date  . Gallstones     History reviewed. No pertinent past surgical history.  No family history on file. Social History:  reports that she has never smoked. She has never used smokeless tobacco. She reports that she does not drink alcohol or use illicit drugs.  Allergies: No Known Allergies   (Not in a hospital admission)  Results for orders placed or performed during the hospital encounter of 07/03/14 (from the past 48 hour(s))  Urinalysis, Routine w reflex microscopic     Status: Abnormal   Collection Time: 07/03/14 10:52 PM  Result Value Ref Range   Color, Urine AMBER (A) YELLOW    Comment: BIOCHEMICALS MAY BE AFFECTED BY COLOR   APPearance CLEAR CLEAR   Specific Gravity, Urine 1.010 1.005 - 1.030   pH 6.0 5.0 - 8.0   Glucose, UA NEGATIVE NEGATIVE mg/dL   Hgb urine dipstick NEGATIVE NEGATIVE   Bilirubin Urine SMALL (A) NEGATIVE   Ketones, ur 40 (A) NEGATIVE mg/dL   Protein, ur NEGATIVE NEGATIVE mg/dL   Urobilinogen, UA 0.2 0.0 - 1.0 mg/dL   Nitrite NEGATIVE NEGATIVE   Leukocytes, UA TRACE (A) NEGATIVE  CBC with Differential     Status: None   Collection Time: 07/03/14 10:52 PM  Result Value Ref Range   WBC 8.2 4.0 - 10.5 K/uL   RBC 4.38 3.87 - 5.11 MIL/uL   Hemoglobin 12.9 12.0 - 15.0 g/dL   HCT 39.2 36.0 - 46.0 %   MCV 89.5 78.0 - 100.0 fL   MCH 29.5 26.0 - 34.0 pg   MCHC 32.9 30.0 - 36.0 g/dL   RDW 12.5 11.5 - 15.5 %   Platelets 243 150 - 400 K/uL   Neutrophils Relative % 73 43 - 77 %   Neutro Abs 6.0 1.7 - 7.7  K/uL   Lymphocytes Relative 21 12 - 46 %   Lymphs Abs 1.7 0.7 - 4.0 K/uL   Monocytes Relative 4 3 - 12 %   Monocytes Absolute 0.4 0.1 - 1.0 K/uL   Eosinophils Relative 2 0 - 5 %   Eosinophils Absolute 0.2 0.0 - 0.7 K/uL   Basophils Relative 0 0 - 1 %   Basophils Absolute 0.0 0.0 - 0.1 K/uL  Comprehensive metabolic panel     Status: Abnormal   Collection Time: 07/03/14 10:52 PM  Result Value Ref Range   Sodium 137 135 - 145 mmol/L   Potassium 3.7 3.5 - 5.1 mmol/L   Chloride 105 96 - 112 mmol/L   CO2 23 19 - 32 mmol/L   Glucose, Bld 97 70 - 99 mg/dL   BUN <5 (L) 6 - 23 mg/dL   Creatinine, Ser 0.76 0.50 - 1.10 mg/dL   Calcium 8.6 8.4 - 10.5 mg/dL   Total Protein 7.2 6.0 - 8.3 g/dL   Albumin 3.8 3.5 - 5.2 g/dL   AST 196 (H) 0 - 37 U/L   ALT 224 (H) 0 - 35 U/L   Alkaline Phosphatase  103 39 - 117 U/L   Total Bilirubin 5.4 (H) 0.3 - 1.2 mg/dL    Comment: DELTA CHECK NOTED   GFR calc non Af Amer >90 >90 mL/min   GFR calc Af Amer >90 >90 mL/min    Comment: (NOTE) The eGFR has been calculated using the CKD EPI equation. This calculation has not been validated in all clinical situations. eGFR's persistently <90 mL/min signify possible Chronic Kidney Disease.    Anion gap 9 5 - 15  Lipase, blood     Status: None   Collection Time: 07/03/14 10:52 PM  Result Value Ref Range   Lipase 29 11 - 59 U/L  Urine microscopic-add on     Status: Abnormal   Collection Time: 07/03/14 10:52 PM  Result Value Ref Range   Squamous Epithelial / LPF MANY (A) RARE   WBC, UA 3-6 <3 WBC/hpf   Bacteria, UA FEW (A) RARE  POC Urine Pregnancy, ED (do NOT order at [facility])     Status: None   Collection Time: 07/03/14 11:02 PM  Result Value Ref Range   Preg Test, Ur NEGATIVE NEGATIVE    Comment:        THE SENSITIVITY OF THIS METHODOLOGY IS >24 mIU/mL    US Abdomen Complete  07/02/2014   CLINICAL DATA:  Abdominal pain.  Right upper quadrant tenderness.  EXAM: ULTRASOUND ABDOMEN COMPLETE  COMPARISON:  None.   FINDINGS: Gallbladder: The long dated but not over distended gallbladder containing multiple small mobile stones measuring up to 7 mm in size. Gallbladder wall was not thickened, measuring 2.4 mm. Sonographic Murphy's sign was negative.  Common bile duct: Diameter: 5 mm  Liver: No focal lesion identified. Within normal limits in parenchymal echogenicity.  IVC: No abnormality visualized.  Pancreas: Visualized portion unremarkable.  Spleen: Size and appearance within normal limits.  Right Kidney: Length: 11.4 cm. Echogenicity within normal limits. No mass or hydronephrosis visualized.  Left Kidney: Length: 11.0 cm. Echogenicity within normal limits. No mass or hydronephrosis visualized.  Abdominal aorta: No aneurysm visualized.  Other findings: None.  IMPRESSION: Cholelithiasis without evidence of acute cholecystitis. No biliary dilatation.   Electronically Signed   By: Logan Bores   On: 07/02/2014 21:17    Review of Systems  Constitutional: Positive for malaise/fatigue.  HENT: Negative.   Eyes: Negative.   Gastrointestinal: Positive for nausea and abdominal pain.  Skin: Negative.   Neurological: Negative.     Blood pressure 107/75, pulse 83, temperature 98.5 F (36.9 C), temperature source Oral, resp. rate 16, height 5' 5"  (1.651 m), weight 99.791 kg (220 lb), last menstrual period 06/26/2014, SpO2 96 %, unknown if currently breastfeeding. Physical Exam  Constitutional: She is oriented to person, place, and time. She appears well-developed and well-nourished.  HENT:  Head: Normocephalic and atraumatic.  Eyes: Pupils are equal, round, and reactive to light. Scleral icterus is present.  Neck: Normal range of motion. Neck supple.  Cardiovascular: Normal rate and regular rhythm.   Respiratory: Effort normal and breath sounds normal.  GI: Soft. There is tenderness in the right upper quadrant and epigastric area. There is positive Murphy's sign. There is no rigidity and no rebound.   Musculoskeletal: Normal range of motion.  Neurological: She is alert and oriented to person, place, and time.  Skin: Skin is warm and dry.  Psychiatric: She has a normal mood and affect.     Assessment/Plan Cholelithiasis Possible choledocolithiasis Acute cholecystitis early    Admit IVF Will need Lap chole/IOC and  possibly ERCP/CBD exploration during this admission Check labs and repeat U/S  May need GI input MRCP  Keep NPO for now Discussed with daughter/patient who helps translate   Jimya Ciani A. 07/04/2014, 12:42 AM

## 2014-07-05 ENCOUNTER — Encounter (HOSPITAL_COMMUNITY): Admission: EM | Disposition: A | Discharge status: Home / Self Care | Payer: Self-pay | Source: Home / Self Care

## 2014-07-05 ENCOUNTER — Inpatient Hospital Stay (HOSPITAL_COMMUNITY): Payer: Medicaid Other | Admitting: Certified Registered"

## 2014-07-05 ENCOUNTER — Encounter (HOSPITAL_COMMUNITY): Payer: Self-pay | Admitting: Certified Registered"

## 2014-07-05 ENCOUNTER — Inpatient Hospital Stay (HOSPITAL_COMMUNITY): Payer: Medicaid Other

## 2014-07-05 HISTORY — PX: CHOLECYSTECTOMY: SHX55

## 2014-07-05 LAB — HEPATIC FUNCTION PANEL
ALK PHOS: 122 U/L — AB (ref 39–117)
ALT: 194 U/L — AB (ref 0–35)
AST: 137 U/L — AB (ref 0–37)
Albumin: 3.3 g/dL — ABNORMAL LOW (ref 3.5–5.2)
BILIRUBIN TOTAL: 2.4 mg/dL — AB (ref 0.3–1.2)
Bilirubin, Direct: 0.8 mg/dL — ABNORMAL HIGH (ref 0.0–0.5)
Indirect Bilirubin: 1.6 mg/dL — ABNORMAL HIGH (ref 0.3–0.9)
Total Protein: 6.5 g/dL (ref 6.0–8.3)

## 2014-07-05 SURGERY — UPPER ENDOSCOPIC ULTRASOUND (EUS) LINEAR
Anesthesia: General

## 2014-07-05 SURGERY — LAPAROSCOPIC CHOLECYSTECTOMY WITH INTRAOPERATIVE CHOLANGIOGRAM
Anesthesia: General | Site: Abdomen

## 2014-07-05 MED ORDER — PANTOPRAZOLE SODIUM 40 MG IV SOLR
40.0000 mg | Freq: Every day | INTRAVENOUS | Status: DC
  Filled 2014-07-05: qty 40

## 2014-07-05 MED ORDER — ONDANSETRON HCL 4 MG/2ML IJ SOLN
INTRAMUSCULAR | Status: AC
  Filled 2014-07-05: qty 2

## 2014-07-05 MED ORDER — ONDANSETRON HCL 4 MG/2ML IJ SOLN
INTRAMUSCULAR | Status: DC | PRN
  Administered 2014-07-05: 4 mg via INTRAVENOUS

## 2014-07-05 MED ORDER — DEXAMETHASONE SODIUM PHOSPHATE 4 MG/ML IJ SOLN
INTRAMUSCULAR | Status: AC
  Filled 2014-07-05: qty 2

## 2014-07-05 MED ORDER — NEOSTIGMINE METHYLSULFATE 10 MG/10ML IV SOLN
INTRAVENOUS | Status: DC | PRN
  Administered 2014-07-05: 3 mg via INTRAVENOUS

## 2014-07-05 MED ORDER — DEXAMETHASONE SODIUM PHOSPHATE 4 MG/ML IJ SOLN
INTRAMUSCULAR | Status: DC | PRN
  Administered 2014-07-05: 8 mg via INTRAVENOUS

## 2014-07-05 MED ORDER — SCOPOLAMINE 1 MG/3DAYS TD PT72
MEDICATED_PATCH | TRANSDERMAL | Status: DC | PRN
  Administered 2014-07-05: 1 via TRANSDERMAL

## 2014-07-05 MED ORDER — HYDROMORPHONE HCL 1 MG/ML IJ SOLN
INTRAMUSCULAR | Status: AC
  Administered 2014-07-05: 0.5 mg via INTRAVENOUS
  Filled 2014-07-05: qty 1

## 2014-07-05 MED ORDER — IOHEXOL 300 MG/ML  SOLN
INTRAMUSCULAR | Status: DC | PRN
  Administered 2014-07-05: 13 mL

## 2014-07-05 MED ORDER — CEFTRIAXONE SODIUM IN DEXTROSE 40 MG/ML IV SOLN
2.0000 g | INTRAVENOUS | Status: AC
  Administered 2014-07-05: 2 g via INTRAVENOUS
  Filled 2014-07-05 (×2): qty 50

## 2014-07-05 MED ORDER — MIDAZOLAM HCL 5 MG/5ML IJ SOLN
INTRAMUSCULAR | Status: DC | PRN
  Administered 2014-07-05: 2 mg via INTRAVENOUS

## 2014-07-05 MED ORDER — LIDOCAINE HCL (CARDIAC) 20 MG/ML IV SOLN
INTRAVENOUS | Status: DC | PRN
  Administered 2014-07-05: 100 mg via INTRAVENOUS

## 2014-07-05 MED ORDER — PANTOPRAZOLE SODIUM 40 MG IV SOLR
40.0000 mg | Freq: Every day | INTRAVENOUS | Status: DC
  Administered 2014-07-05: 40 mg via INTRAVENOUS

## 2014-07-05 MED ORDER — BUPIVACAINE-EPINEPHRINE 0.25% -1:200000 IJ SOLN
INTRAMUSCULAR | Status: DC | PRN
  Administered 2014-07-05: 15 mL

## 2014-07-05 MED ORDER — PROMETHAZINE HCL 25 MG/ML IJ SOLN: 6.2500 mg | INTRAMUSCULAR | Status: DC | PRN

## 2014-07-05 MED ORDER — SUCCINYLCHOLINE CHLORIDE 20 MG/ML IJ SOLN
INTRAMUSCULAR | Status: DC | PRN
  Administered 2014-07-05: 100 mg via INTRAVENOUS

## 2014-07-05 MED ORDER — OXYCODONE HCL 5 MG/5ML PO SOLN: 5.0000 mg | Freq: Once | ORAL | Status: DC | PRN

## 2014-07-05 MED ORDER — HEMOSTATIC AGENTS (NO CHARGE) OPTIME
TOPICAL | Status: DC | PRN
  Administered 2014-07-05: 1 via TOPICAL

## 2014-07-05 MED ORDER — SCOPOLAMINE 1 MG/3DAYS TD PT72
MEDICATED_PATCH | TRANSDERMAL | Status: AC
  Filled 2014-07-05: qty 1

## 2014-07-05 MED ORDER — LACTATED RINGERS IV SOLN
INTRAVENOUS | Status: DC | PRN
  Administered 2014-07-05 (×2): via INTRAVENOUS

## 2014-07-05 MED ORDER — MIDAZOLAM HCL 2 MG/2ML IJ SOLN
INTRAMUSCULAR | Status: AC
  Filled 2014-07-05: qty 2

## 2014-07-05 MED ORDER — SODIUM CHLORIDE 0.9 % IR SOLN
Status: DC | PRN
  Administered 2014-07-05 (×5): 1000 mL

## 2014-07-05 MED ORDER — BUPIVACAINE-EPINEPHRINE (PF) 0.25% -1:200000 IJ SOLN
INTRAMUSCULAR | Status: AC
  Filled 2014-07-05: qty 30

## 2014-07-05 MED ORDER — 0.9 % SODIUM CHLORIDE (POUR BTL) OPTIME
TOPICAL | Status: DC | PRN
  Administered 2014-07-05: 1000 mL

## 2014-07-05 MED ORDER — HYDROMORPHONE HCL 1 MG/ML IJ SOLN
0.2500 mg | INTRAMUSCULAR | Status: DC | PRN
  Administered 2014-07-05 (×2): 0.5 mg via INTRAVENOUS

## 2014-07-05 MED ORDER — FENTANYL CITRATE (PF) 100 MCG/2ML IJ SOLN
INTRAMUSCULAR | Status: DC | PRN
  Administered 2014-07-05: 100 ug via INTRAVENOUS
  Administered 2014-07-05 (×5): 50 ug via INTRAVENOUS

## 2014-07-05 MED ORDER — HYDROCODONE-ACETAMINOPHEN 5-325 MG PO TABS
1.0000 | ORAL_TABLET | ORAL | Status: DC | PRN
  Administered 2014-07-06: 1 via ORAL
  Filled 2014-07-05: qty 1

## 2014-07-05 MED ORDER — ROCURONIUM BROMIDE 100 MG/10ML IV SOLN
INTRAVENOUS | Status: DC | PRN
  Administered 2014-07-05: 40 mg via INTRAVENOUS

## 2014-07-05 MED ORDER — KETOROLAC TROMETHAMINE 30 MG/ML IJ SOLN
30.0000 mg | Freq: Once | INTRAMUSCULAR | Status: AC | PRN
  Administered 2014-07-05: 30 mg via INTRAVENOUS

## 2014-07-05 MED ORDER — PROPOFOL 10 MG/ML IV BOLUS
INTRAVENOUS | Status: DC | PRN
  Administered 2014-07-05: 50 mg via INTRAVENOUS
  Administered 2014-07-05: 150 mg via INTRAVENOUS

## 2014-07-05 MED ORDER — OXYCODONE HCL 5 MG PO TABS: 5.0000 mg | ORAL_TABLET | Freq: Once | ORAL | Status: DC | PRN

## 2014-07-05 MED ORDER — GLYCOPYRROLATE 0.2 MG/ML IJ SOLN
INTRAMUSCULAR | Status: AC
  Filled 2014-07-05: qty 2

## 2014-07-05 MED ORDER — FENTANYL CITRATE (PF) 250 MCG/5ML IJ SOLN
INTRAMUSCULAR | Status: AC
  Filled 2014-07-05: qty 5

## 2014-07-05 MED ORDER — GLYCOPYRROLATE 0.2 MG/ML IJ SOLN
INTRAMUSCULAR | Status: DC | PRN
  Administered 2014-07-05: 0.4 mg via INTRAVENOUS

## 2014-07-05 MED ORDER — KETOROLAC TROMETHAMINE 30 MG/ML IJ SOLN
INTRAMUSCULAR | Status: AC
  Filled 2014-07-05: qty 1

## 2014-07-05 MED ORDER — NEOSTIGMINE METHYLSULFATE 10 MG/10ML IV SOLN
INTRAVENOUS | Status: AC
  Filled 2014-07-05: qty 1

## 2014-07-05 SURGICAL SUPPLY — 51 items
APPLIER CLIP 5 13 M/L LIGAMAX5 (MISCELLANEOUS) ×3
APR CLP MED LRG 5 ANG JAW (MISCELLANEOUS) ×1
BAG SPEC RTRVL 10 TROC 200 (ENDOMECHANICALS) ×1
BLADE SURG ROTATE 9660 (MISCELLANEOUS) IMPLANT
CANISTER SUCTION 2500CC (MISCELLANEOUS) ×3 IMPLANT
CHLORAPREP W/TINT 26ML (MISCELLANEOUS) ×3 IMPLANT
CLIP APPLIE 5 13 M/L LIGAMAX5 (MISCELLANEOUS) ×1 IMPLANT
CLOSURE WOUND 1/2 X4 (GAUZE/BANDAGES/DRESSINGS)
COVER MAYO STAND STRL (DRAPES) ×3 IMPLANT
COVER SURGICAL LIGHT HANDLE (MISCELLANEOUS) ×3 IMPLANT
DEVICE TROCAR PUNCTURE CLOSURE (ENDOMECHANICALS) ×2 IMPLANT
DRAPE C-ARM 42X72 X-RAY (DRAPES) ×3 IMPLANT
DRAPE LAPAROSCOPIC ABDOMINAL (DRAPES) ×3 IMPLANT
ELECT REM PT RETURN 9FT ADLT (ELECTROSURGICAL) ×3
ELECTRODE REM PT RTRN 9FT ADLT (ELECTROSURGICAL) ×1 IMPLANT
ENDOLOOP SUT PDS II  0 18 (SUTURE) ×2
ENDOLOOP SUT PDS II 0 18 (SUTURE) IMPLANT
GLOVE BIO SURGEON STRL SZ7 (GLOVE) ×5 IMPLANT
GLOVE BIOGEL PI IND STRL 7.0 (GLOVE) IMPLANT
GLOVE BIOGEL PI IND STRL 7.5 (GLOVE) ×1 IMPLANT
GLOVE BIOGEL PI IND STRL 8 (GLOVE) IMPLANT
GLOVE BIOGEL PI INDICATOR 7.0 (GLOVE) ×2
GLOVE BIOGEL PI INDICATOR 7.5 (GLOVE) ×4
GLOVE BIOGEL PI INDICATOR 8 (GLOVE) ×2
GLOVE ECLIPSE 7.0 STRL STRAW (GLOVE) ×2 IMPLANT
GLOVE ECLIPSE 7.5 STRL STRAW (GLOVE) ×2 IMPLANT
GOWN STRL REUS W/ TWL LRG LVL3 (GOWN DISPOSABLE) ×3 IMPLANT
GOWN STRL REUS W/TWL LRG LVL3 (GOWN DISPOSABLE) ×12
HEMOSTAT SNOW SURGICEL 2X4 (HEMOSTASIS) ×2 IMPLANT
KIT BASIN OR (CUSTOM PROCEDURE TRAY) ×3 IMPLANT
KIT ROOM TURNOVER OR (KITS) ×3 IMPLANT
LIQUID BAND (GAUZE/BANDAGES/DRESSINGS) ×3 IMPLANT
NS IRRIG 1000ML POUR BTL (IV SOLUTION) ×3 IMPLANT
PAD ARMBOARD 7.5X6 YLW CONV (MISCELLANEOUS) ×3 IMPLANT
POUCH RETRIEVAL ECOSAC 10 (ENDOMECHANICALS) ×1 IMPLANT
POUCH RETRIEVAL ECOSAC 10MM (ENDOMECHANICALS) ×2
SCISSORS LAP 5X35 DISP (ENDOMECHANICALS) ×3 IMPLANT
SET CHOLANGIOGRAPH 5 50 .035 (SET/KITS/TRAYS/PACK) ×3 IMPLANT
SET IRRIG TUBING LAPAROSCOPIC (IRRIGATION / IRRIGATOR) ×3 IMPLANT
SLEEVE ENDOPATH XCEL 5M (ENDOMECHANICALS) ×6 IMPLANT
SPECIMEN JAR SMALL (MISCELLANEOUS) ×3 IMPLANT
STRIP CLOSURE SKIN 1/2X4 (GAUZE/BANDAGES/DRESSINGS) IMPLANT
SUT MNCRL AB 4-0 PS2 18 (SUTURE) ×3 IMPLANT
SUT VICRYL 0 UR6 27IN ABS (SUTURE) ×2 IMPLANT
TOWEL OR 17X24 6PK STRL BLUE (TOWEL DISPOSABLE) ×3 IMPLANT
TOWEL OR 17X26 10 PK STRL BLUE (TOWEL DISPOSABLE) ×3 IMPLANT
TRAY LAPAROSCOPIC (CUSTOM PROCEDURE TRAY) ×3 IMPLANT
TROCAR XCEL BLUNT TIP 100MML (ENDOMECHANICALS) ×3 IMPLANT
TROCAR XCEL NON-BLD 11X100MML (ENDOMECHANICALS) ×2 IMPLANT
TROCAR XCEL NON-BLD 5MMX100MML (ENDOMECHANICALS) ×3 IMPLANT
TUBING INSUFFLATION (TUBING) ×3 IMPLANT

## 2014-07-05 NOTE — Transfer of Care (Signed)
Immediate Anesthesia Transfer of Care Note  Patient: Renee Espinoza  Procedure(s) Performed: Procedure(s): LAPAROSCOPIC CHOLECYSTECTOMY WITH INTRAOPERATIVE CHOLANGIOGRAM (N/A)  Patient Location: PACU  Anesthesia Type:General  Level of Consciousness: awake, alert , oriented and patient cooperative  Airway & Oxygen Therapy: Patient Spontanous Breathing and Patient connected to nasal cannula oxygen  Post-op Assessment: Report given to RN, Post -op Vital signs reviewed and stable and Patient moving all extremities  Post vital signs: Reviewed and stable  Last Vitals:  Filed Vitals:   07/05/14 0824  BP: 132/88  Pulse: 80  Temp: 36.2 C  Resp: 17    Complications: No apparent anesthesia complications

## 2014-07-05 NOTE — Anesthesia Procedure Notes (Signed)
Procedure Name: Intubation Date/Time: 07/05/2014 9:47 AM Performed by: Jerilee HohMUMM, Finnick Orosz N Pre-anesthesia Checklist: Patient identified, Emergency Drugs available, Suction available and Patient being monitored Patient Re-evaluated:Patient Re-evaluated prior to inductionOxygen Delivery Method: Circle system utilized Preoxygenation: Pre-oxygenation with 100% oxygen Intubation Type: IV induction Ventilation: Mask ventilation without difficulty Laryngoscope Size: Mac and 3 Grade View: Grade I Tube type: Oral Tube size: 7.5 mm Number of attempts: 1 Airway Equipment and Method: Stylet Placement Confirmation: ETT inserted through vocal cords under direct vision,  positive ETCO2 and breath sounds checked- equal and bilateral Secured at: 22 cm Tube secured with: Tape Dental Injury: Teeth and Oropharynx as per pre-operative assessment

## 2014-07-05 NOTE — Anesthesia Preprocedure Evaluation (Addendum)
Anesthesia Evaluation  Patient identified by MRN, date of birth, ID band Patient awake    Reviewed: Allergy & Precautions, NPO status , Patient's Chart, lab work & pertinent test results  Airway Mallampati: II  TM Distance: >3 FB Neck ROM: Full    Dental  (+) Teeth Intact, Dental Advisory Given   Pulmonary neg pulmonary ROS,  breath sounds clear to auscultation        Cardiovascular negative cardio ROS  Rhythm:Regular Rate:Normal     Neuro/Psych negative neurological ROS     GI/Hepatic negative GI ROS, Neg liver ROS,   Endo/Other  Morbid obesity  Renal/GU negative Renal ROS     Musculoskeletal   Abdominal   Peds  Hematology negative hematology ROS (+)   Anesthesia Other Findings   Reproductive/Obstetrics                           Anesthesia Physical Anesthesia Plan  ASA: II  Anesthesia Plan: General   Post-op Pain Management:    Induction: Intravenous  Airway Management Planned: Oral ETT  Additional Equipment:   Intra-op Plan:   Post-operative Plan: Extubation in OR  Informed Consent: I have reviewed the patients History and Physical, chart, labs and discussed the procedure including the risks, benefits and alternatives for the proposed anesthesia with the patient or authorized representative who has indicated his/her understanding and acceptance.   Dental advisory given  Plan Discussed with: CRNA  Anesthesia Plan Comments:         Anesthesia Quick Evaluation

## 2014-07-05 NOTE — Progress Notes (Signed)
Patient ID: Renee DebarClaudia Espinoza, female   DOB: 04/03/1975, 39 y.o.   MRN: [id number]    Subjective: No pain, but having some nausea today.  Objective: Vital signs in last 24 hours: Temp:  [97.8 F (36.6 C)-98.5 F (36.9 C)] 98.5 F (36.9 C) (04/22 0627) Pulse Rate:  [66-90] 66 (04/22 0627) Resp:  [16] 16 (04/22 0627) BP: (105-124)/(52-82) 105/52 mmHg (04/22 0627) SpO2:  [97 %-100 %] 99 % (04/22 0627) Last BM Date: 07/02/14  Intake/Output from previous day: 04/21 0701 - 04/22 0700 In: 805 [P.O.:60; I.V.:745] Out: -  Intake/Output this shift:    PE: Abd: soft, minimally tender, +BS, ND Heart: regular Lungs: CTAB  Lab Results:   Recent Labs  07/03/14 2252 07/04/14 0724  WBC 8.2 5.9  HGB 12.9 12.6  HCT 39.2 38.9  PLT 243 230   BMET  Recent Labs  07/03/14 2252 07/04/14 0724  NA 137 139  K 3.7 3.8  CL 105 108  CO2 23 23  GLUCOSE 97 107*  BUN <5* <5*  CREATININE 0.76 0.69  CALCIUM 8.6 8.4   PT/INR No results for input(s): LABPROT, INR in the last 72 hours. CMP     Component Value Date/Time   NA 139 07/04/2014 0724   K 3.8 07/04/2014 0724   CL 108 07/04/2014 0724   CO2 23 07/04/2014 0724   GLUCOSE 107* 07/04/2014 0724   BUN <5* 07/04/2014 0724   CREATININE 0.69 07/04/2014 0724   CALCIUM 8.4 07/04/2014 0724   PROT 6.5 07/05/2014 0440   ALBUMIN 3.3* 07/05/2014 0440   AST 137* 07/05/2014 0440   ALT 194* 07/05/2014 0440   ALKPHOS 122* 07/05/2014 0440   BILITOT 2.4* 07/05/2014 0440   GFRNONAA >90 07/04/2014 0724   GFRAA >90 07/04/2014 0724   Lipase     Component Value Date/Time   LIPASE 29 07/03/2014 2252       Studies/Results: [place] Abdomen Limited Ruq  07/04/2014   CLINICAL DATA:  Abdominal pain  EXAM: US ABDOMEN LIMITED - RIGHT UPPER QUADRANT  COMPARISON:  07/02/2014  FINDINGS: Gallbladder:  Cholelithiasis. Gallbladder wall thickness at 2.5 mm. No pericholecystic fluid. Negative sonographic Murphy sign.  Common bile duct:  Diameter: 6 mm, within  normal limits.  Liver:  Mild diffusely increased in echogenicity. No focal lesion identified.  IMPRESSION: Cholelithiasis without sonographic evidence of acute cholecystitis. Recommend HIDA scan if clinical concern persists.  Mild hepatic steatosis.   Electronically Signed   By: Jearld LeschAndrew  DelGaizo M.D.   On: 07/04/2014 02:33    Anti-infectives: Anti-infectives    Start     Dose/Rate Route Frequency Ordered Stop   07/05/14 0800  cefTRIAXone (ROCEPHIN) 2 g in dextrose 5 % 50 mL IVPB - Premix     2 g 100 mL/hr over 30 Minutes Intravenous On call to O.R. 07/05/14 0757 07/06/14 0559       Assessment/Plan  1. Biliary colic/? Choledocholithiasis, hyperbilirubinemia -I have d/w Dr. Elnoria HowardHung.  Since her labs are down today, we will just go ahead and plan to go to OR and do IOC.  If IOC +, then can do post op ERCP, if -, then routine post op care.  This was d/w the patient.  She is agreeable. -to OR today   LOS: 1 day    Lucylle Foulkes E 07/05/2014, 8:04 AM Pager: [phone]

## 2014-07-05 NOTE — Anesthesia Postprocedure Evaluation (Signed)
  Anesthesia Post-op Note  Patient: Renee Espinoza  Procedure(s) Performed: Procedure(s): LAPAROSCOPIC CHOLECYSTECTOMY WITH INTRAOPERATIVE CHOLANGIOGRAM (N/A)  Patient Location: PACU  Anesthesia Type:General  Level of Consciousness: awake and alert   Airway and Oxygen Therapy: Patient Spontanous Breathing  Post-op Pain: mild  Post-op Assessment: Post-op Vital signs reviewed  Post-op Vital Signs: Reviewed  Last Vitals:  Filed Vitals:   07/05/14 1302  BP: 112/66  Pulse: 69  Temp: 36.6 C  Resp: 17    Complications: No apparent anesthesia complications

## 2014-07-05 NOTE — Op Note (Signed)
Preoperative diagnosis: acute cholecystitis Postoperative diagnosis: same as above Procedure: laparoscopic cholecystectomy with cholangiogram Surgeon: Dr Harden MoMatt Korina Tretter Asst: Ms Barnetta ChapelKelly Osborne, PA-C Anesthesia: general EBL: minimal Drains none Specimen gb and contents to pathology Complications: none Sponge count correct at completion Disposition to recovery stable  Indications: This is a [age] yof who presents with acute cholecystitis and choledocholithiasis.  Her lfts have improved. We then discussed proceeding with her laparoscopic cholecystectomy with a cholangiogram.  Procedure: After informed consent was obtained the patient was taken to the operating room. She was on antibiotics. Sequential compression devices were on her legs. She was placed under general anesthesia without complication. Her abdomen was prepped and draped in the standard sterile surgical fashion. A surgical timeout was then performed.  I infiltrated marcaine below the umbilicus and made a vertical incision. I entered into the peritoneum. I placed a 0 vicryl pursestring suture and inserted a hasson trocar. She did have acute cholecystitis.  I was able to with blunt dissection free the gallbladder from the omentum which was adherent. I then was able to identify the cystic duct and clearly had the critical view of safety. Idid make a rent in the gallbladder and spilled dark bile and small stones. I sized the epigastric trocar up to an 11mm and evacuated all these stones. I irrigated copiously at the beginning and then at the end to remove any stones. I then clipped the cystic duct distally. I made a ductotomy and did a cholangiogram.  the cholangiogram showed I was in the cystic duct, filled both sides of the liver and rapidly filled the duodenum with no evidence of obstruction.I then clipped the cystic duct and divided it. The duct was viable and the clips traversed the duct. I then treated the artery in similar fashion. I  then removed the gallbladder from the liver bed and placed it in a bag. It was then removed from the umbilical incision. I then obtained hemostasis and irrigated. I then desufflated the abdomen and removed all my trocars. I did close the umbilical incision with an additional 0 vicryl suture with the endoclose device. I then close these with 4-0 Monocryl and Dermabond. She tolerated this well was extubated and transferred to the recovery room in stable condition.

## 2014-07-05 NOTE — Discharge Instructions (Signed)
[facility] ______[facility] °LAPAROSCOPIC SURGERY: POST OP INSTRUCTIONS °Always review your discharge instruction sheet given to you by the facility where your surgery was performed. °IF YOU HAVE DISABILITY OR FAMILY LEAVE FORMS, YOU MUST BRING THEM TO THE OFFICE FOR PROCESSING.   °DO NOT GIVE THEM TO YOUR DOCTOR. ° °1. A prescription for pain medication may be given to you upon discharge.  Take your pain medication as prescribed, if needed.  If narcotic pain medicine is not needed, then you may take acetaminophen (Tylenol) or ibuprofen (Advil) as needed. °2. Take your usually prescribed medications unless otherwise directed. °3. If you need a refill on your pain medication, please contact your pharmacy.  They will contact our office to request authorization. Prescriptions will not be filled after 5pm or on week-ends. °4. You should follow a light diet the first few days after arrival home, such as soup and crackers, etc.  Be sure to include lots of fluids daily. °5. Most patients will experience some swelling and bruising in the area of the incisions.  Ice packs will help.  Swelling and bruising can take several days to resolve.  °6. It is common to experience some constipation if taking pain medication after surgery.  Increasing fluid intake and taking a stool softener (such as Colace) will usually help or prevent this problem from occurring.  A mild laxative (Milk of Magnesia or Miralax) should be taken according to package instructions if there are no bowel movements after 48 hours. °7. Unless discharge instructions indicate otherwise, you may remove your bandages 24-48 hours after surgery, and you may shower at that time.  You may have steri-strips (small skin tapes) in place directly over the incision.  These strips should be left on the skin for 7-10 days.  If your surgeon used skin glue on the incision, you may shower in 24 hours.  The glue will flake off over the next 2-3 weeks.  Any sutures or  staples will be removed at the office during your follow-up visit. °8. ACTIVITIES:  You may resume regular (light) daily activities beginning the next day--such as daily self-care, walking, climbing stairs--gradually increasing activities as tolerated.  You may have sexual intercourse when it is comfortable.  Refrain from any heavy lifting or straining until approved by your doctor. °a. You may drive when you are no longer taking prescription pain medication, you can comfortably wear a seatbelt, and you can safely maneuver your car and apply brakes. °b. RETURN TO WORK:  __________________________________________________________ °9. You should see your doctor in the office for a follow-up appointment approximately 2-3 weeks after your surgery.  Make sure that you call for this appointment within a day or two after you arrive home to insure a convenient appointment time. °10. OTHER INSTRUCTIONS: __________________________________________________________________________________________________________________________ __________________________________________________________________________________________________________________________ °WHEN TO CALL YOUR DOCTOR: °1. Fever over 101.0 °2. Inability to urinate °3. Continued bleeding from incision. °4. Increased pain, redness, or drainage from the incision. °5. Increasing abdominal pain ° °The clinic staff is available to answer your questions during regular business hours.  Please don’t hesitate to call and ask to speak to one of the nurses for clinical concerns.  If you have a medical emergency, go to the nearest emergency room or call 911.  A surgeon from [facility] is always on call at the hospital. °[address], [address], [place], [place]  [address] ? [address] [address], , [place]   [address] °[phone] ? [phone] ? FAX [phone] °Web site:   [email or web address] °

## 2014-07-06 LAB — COMPREHENSIVE METABOLIC PANEL
ALT: 190 U/L — ABNORMAL HIGH (ref 0–35)
AST: 118 U/L — ABNORMAL HIGH (ref 0–37)
Albumin: 3 g/dL — ABNORMAL LOW (ref 3.5–5.2)
Alkaline Phosphatase: 111 U/L (ref 39–117)
Anion gap: 8 (ref 5–15)
CO2: 22 mmol/L (ref 19–32)
Calcium: 8.2 mg/dL — ABNORMAL LOW (ref 8.4–10.5)
Chloride: 108 mmol/L (ref 96–112)
Creatinine, Ser: 0.59 mg/dL (ref 0.50–1.10)
GLUCOSE: 112 mg/dL — AB (ref 70–99)
Potassium: 3.8 mmol/L (ref 3.5–5.1)
Sodium: 138 mmol/L (ref 135–145)
TOTAL PROTEIN: 6 g/dL (ref 6.0–8.3)
Total Bilirubin: 1.2 mg/dL (ref 0.3–1.2)

## 2014-07-06 MED ORDER — HYDROCODONE-ACETAMINOPHEN 5-325 MG PO TABS: 1.0000 | ORAL_TABLET | ORAL | Status: DC | PRN

## 2014-07-06 NOTE — Progress Notes (Signed)
Discharge instructions gone over using interpretor. Prescription given. Home medications gone over. Follow up appointment is made. Diet, incisional care, activity, and reasons to call the doctor gone over. Patient and family verbalized understanding of instructions.

## 2014-07-06 NOTE — Discharge Summary (Addendum)
  Patient ID: Donnal DebarClaudia Espinoza [id number] 38 y.o. 09/05/1975  Admit date: 07/03/2014  Discharge date and time: 07/06/2014  Admitting Physician: Harriette Bouillonhomas Cornett, MD  Discharge Physician: Ernestene MentionINGRAM,Norene Oliveri M  Admission Diagnoses: Cholecystitis [K81.9] Gallstones [K80.20]  Discharge Diagnoses: same  Operations: Procedure(s): LAPAROSCOPIC CHOLECYSTECTOMY WITH INTRAOPERATIVE CHOLANGIOGRAM  Admission Condition: fair  Discharged Condition: good  Indication for Admission: This is a 39 year old Hispanic female, speaks no English but has family that does translate. Admitted from the emergency room with right upper quadrant abdominal pain. Ultrasound and lab showed stone in gallbladder, CBD 5 mm. Bilirubin 5.4.  Hospital Course: The patient was admitted and started on IV fluids, IV antibiotics, and analgesics and antibiotics. She was taken to the operating room by Dr. Dwain SarnaWakefield on 07/05/2014 and she underwent laparoscopic cholecystectomy with cholangiogram. She did have acute cholecystitis. The cholangiogram was normal. She was admitted postop for observation. She became ambulatory. On postop day 1 she was ambulating in the hall, tolerating a liquid diet. Wanted to go home. Abdomen was soft and benign. Wounds looked good. Liver function tests showed that the transaminases were coming down and the bilirubin had returned to normal at 1.2.   She was given instructions in diet and activities. She was given a prescription for Norco for pain. [facility] was [facility].  Consults: None  Significant Diagnostic Studies: Lab work and ultrasound  Treatments: surgery: Laparoscopic cholecystectomy with cholangiogram  Disposition: Home  Patient Instructions:    Medication List    TAKE these medications        acetaminophen 500 MG tablet  Commonly known as:  TYLENOL  Take 500 mg by mouth every 6 (six) hours as needed for mild pain.     HYDROcodone-acetaminophen 5-325 MG per tablet  Commonly known as:  NORCO/VICODIN  Take 1 tablet by mouth every 4 (four) hours as needed.     HYDROcodone-acetaminophen 5-325 MG per tablet  Commonly known as:  NORCO/VICODIN  Take 1 tablet by mouth every 4 (four) hours as needed for moderate pain.     multivitamin with minerals Tabs tablet  Take 1 tablet by mouth daily.     ondansetron 4 MG tablet  Commonly known as:  ZOFRAN  Take 1 tablet (4 mg total) by mouth every 6 (six) hours.        Activity: May drive car in 2-3 days. No sports or heavy lifting for 3 weeks. Diet: low fat, low cholesterol diet Wound Care: none needed  [facility]  [facility].  Signed: Angelia MouldHaywood M. Derrell LollingIngram, M.D., FACS General and minimally invasive surgery Breast and Colorectal Surgery  07/06/2014, 8:40 AM

## 2014-07-08 ENCOUNTER — Encounter (HOSPITAL_COMMUNITY): Payer: Self-pay | Admitting: General Surgery

## 2015-03-16 DIAGNOSIS — N841 Polyp of cervix uteri: Secondary | ICD-10-CM

## 2015-03-16 HISTORY — DX: Polyp of cervix uteri: N84.1

## 2015-03-29 ENCOUNTER — Encounter (HOSPITAL_COMMUNITY): Payer: Self-pay

## 2015-03-29 ENCOUNTER — Encounter (HOSPITAL_COMMUNITY): Payer: Self-pay | Admitting: Emergency Medicine

## 2015-03-29 ENCOUNTER — Emergency Department (INDEPENDENT_AMBULATORY_CARE_PROVIDER_SITE_OTHER): Admission: EM | Admit: 2015-03-29 | Discharge: 2015-03-29 | Payer: Self-pay | Source: Home / Self Care

## 2015-03-29 ENCOUNTER — Observation Stay (HOSPITAL_COMMUNITY)
Admission: AD | Admit: 2015-03-29 | Discharge: 2015-03-30 | Disposition: A | Discharge status: Home / Self Care | Payer: Medicaid Other | Source: Ambulatory Visit | Attending: Obstetrics & Gynecology | Admitting: Obstetrics & Gynecology

## 2015-03-29 DIAGNOSIS — D649 Anemia, unspecified: Secondary | ICD-10-CM | POA: Diagnosis not present

## 2015-03-29 DIAGNOSIS — R61 Generalized hyperhidrosis: Secondary | ICD-10-CM | POA: Diagnosis not present

## 2015-03-29 DIAGNOSIS — R51 Headache: Secondary | ICD-10-CM | POA: Diagnosis not present

## 2015-03-29 DIAGNOSIS — N920 Excessive and frequent menstruation with regular cycle: Secondary | ICD-10-CM

## 2015-03-29 DIAGNOSIS — N841 Polyp of cervix uteri: Secondary | ICD-10-CM | POA: Diagnosis present

## 2015-03-29 DIAGNOSIS — N939 Abnormal uterine and vaginal bleeding, unspecified: Secondary | ICD-10-CM | POA: Diagnosis present

## 2015-03-29 DIAGNOSIS — R110 Nausea: Secondary | ICD-10-CM | POA: Diagnosis not present

## 2015-03-29 DIAGNOSIS — Z3202 Encounter for pregnancy test, result negative: Secondary | ICD-10-CM | POA: Insufficient documentation

## 2015-03-29 DIAGNOSIS — R000 Tachycardia, unspecified: Secondary | ICD-10-CM | POA: Diagnosis not present

## 2015-03-29 DIAGNOSIS — Z8719 Personal history of other diseases of the digestive system: Secondary | ICD-10-CM | POA: Insufficient documentation

## 2015-03-29 DIAGNOSIS — R0602 Shortness of breath: Secondary | ICD-10-CM | POA: Insufficient documentation

## 2015-03-29 HISTORY — DX: Polyp of cervix uteri: N84.1

## 2015-03-29 LAB — CBC
HEMATOCRIT: 31.4 % — AB (ref 36.0–46.0)
HEMATOCRIT: 29.2 % — AB (ref 36.0–46.0)
Hemoglobin: 10.4 g/dL — ABNORMAL LOW (ref 12.0–15.0)
Hemoglobin: 9.7 g/dL — ABNORMAL LOW (ref 12.0–15.0)
MCH: 29.5 pg (ref 26.0–34.0)
MCH: 29.7 pg (ref 26.0–34.0)
MCHC: 33.1 g/dL (ref 30.0–36.0)
MCHC: 33.2 g/dL (ref 30.0–36.0)
MCV: 89 fL (ref 78.0–100.0)
MCV: 89.3 fL (ref 78.0–100.0)
PLATELETS: 237 10*3/uL (ref 150–400)
Platelets: 250 10*3/uL (ref 150–400)
RBC: 3.53 MIL/uL — ABNORMAL LOW (ref 3.87–5.11)
RBC: 3.27 MIL/uL — ABNORMAL LOW (ref 3.87–5.11)
RDW: 12.6 % (ref 11.5–15.5)
RDW: 12.8 % (ref 11.5–15.5)
WBC: 12.5 10*3/uL — ABNORMAL HIGH (ref 4.0–10.5)
WBC: 11.4 10*3/uL — AB (ref 4.0–10.5)

## 2015-03-29 LAB — POCT I-STAT, CHEM 8
BUN: 7 mg/dL (ref 6–20)
CALCIUM ION: 1.13 mmol/L (ref 1.12–1.23)
CHLORIDE: 104 mmol/L (ref 101–111)
Creatinine, Ser: 0.6 mg/dL (ref 0.44–1.00)
Glucose, Bld: 165 mg/dL — ABNORMAL HIGH (ref 65–99)
HCT: 36 % (ref 36.0–46.0)
Hemoglobin: 12.2 g/dL (ref 12.0–15.0)
Potassium: 3.3 mmol/L — ABNORMAL LOW (ref 3.5–5.1)
Sodium: 140 mmol/L (ref 135–145)
TCO2: 21 mmol/L (ref 0–100)

## 2015-03-29 LAB — COMPREHENSIVE METABOLIC PANEL
ALT: 20 U/L (ref 14–54)
AST: 25 U/L (ref 15–41)
Albumin: 3.5 g/dL (ref 3.5–5.0)
Alkaline Phosphatase: 60 U/L (ref 38–126)
Anion gap: 10 (ref 5–15)
BILIRUBIN TOTAL: 1 mg/dL (ref 0.3–1.2)
BUN: 10 mg/dL (ref 6–20)
CHLORIDE: 107 mmol/L (ref 101–111)
CO2: 22 mmol/L (ref 22–32)
Calcium: 8.1 mg/dL — ABNORMAL LOW (ref 8.9–10.3)
Creatinine, Ser: 0.66 mg/dL (ref 0.44–1.00)
GFR calc non Af Amer: >60 mL/min (ref >60)
Glucose, Bld: 128 mg/dL — ABNORMAL HIGH (ref 65–99)
Potassium: 3.7 mmol/L (ref 3.5–5.1)
Sodium: 139 mmol/L (ref 135–145)
Total Protein: 6.2 g/dL — ABNORMAL LOW (ref 6.5–8.1)

## 2015-03-29 LAB — TYPE AND SCREEN
ABO/RH(D): A POS
Antibody Screen: NEGATIVE

## 2015-03-29 LAB — HCG, QUANTITATIVE, PREGNANCY

## 2015-03-29 LAB — GLUCOSE, CAPILLARY: Glucose-Capillary: 158 mg/dL — ABNORMAL HIGH (ref 65–99)

## 2015-03-29 MED ORDER — MEGESTROL ACETATE 20 MG PO TABS: 40.0000 mg | ORAL_TABLET | Freq: Two times a day (BID) | ORAL | Status: AC

## 2015-03-29 MED ORDER — ESTROGENS CONJUGATED 25 MG IJ SOLR
25.0000 mg | Freq: Once | INTRAMUSCULAR | Status: AC
  Administered 2015-03-29: 25 mg via INTRAVENOUS
  Filled 2015-03-29: qty 25

## 2015-03-29 MED ORDER — MEGESTROL ACETATE 40 MG PO TABS: 40.0000 mg | ORAL_TABLET | Freq: Two times a day (BID) | ORAL | Status: DC

## 2015-03-29 MED ORDER — MEGESTROL ACETATE 40 MG PO TABS
120.0000 mg | ORAL_TABLET | Freq: Once | ORAL | Status: AC
  Administered 2015-03-29: 120 mg via ORAL
  Filled 2015-03-29: qty 3

## 2015-03-29 NOTE — Discharge Instructions (Signed)
Sangrado uterino anormal  (Abnormal Uterine Bleeding)  El sangrado uterino anormal puede afectar a las mujeres que están en diversas etapas de la vida, desde adolescentes, mujeres fértiles y mujeres embarazadas, hasta mujeres que han llegado a la menopausia. Hay diversas clases de sangrado uterino que se consideran anormales, entre ellas:  · Pérdidas de sangre o hemorragias entre los períodos.  · Hemorragias luego de mantener relaciones sexuales.  · Sangrado abundante o más que lo habitual.  · Períodos que duran más que lo normal.  · Sangrado luego de la menopausia.  Muchos casos de sangrado uterino anormal son leves y simples de tratar, mientras que otros son más graves. El médico debe evaluar cualquier clase de sangrado anormal. El tratamiento dependerá de la causa del sangrado.  INSTRUCCIONES PARA EL CUIDADO EN EL HOGAR  Controle su afección para ver si hay cambios. Las siguientes indicaciones ayudarán a aliviar cualquier molestia que pueda sentir:  · Evite las duchas vaginales y el uso de tampones según las indicaciones del médico.  · Cámbiese las compresas con frecuencia.  Deberá hacerse exámenes pélvicos regulares y pruebas de Papanicolaou. Cumpla con todas las visitas de control y exámanes diagnósticos, según le indique su médico.   SOLICITE ATENCIÓN MÉDICA SI:   · El sangrado dura más de 1 semana.  · Se siente mareada por momentos.  SOLICITE ATENCIÓN MÉDICA DE INMEDIATO SI:   · Se desmaya.  · Debe cambiarse la compresa cada 15 a 30 minutos.  · Siente dolor abdominal.  · Tiene fiebre.  · Se siente débil o presenta sudoración.  · Elimina coágulos grandes por la vagina.  · Comienza a sentir náuseas y vomita.  ASEGÚRESE DE QUE:   · Comprende estas instrucciones.  · Controlará su afección.  · Recibirá ayuda de inmediato si no mejora o si empeora.     Esta información no tiene como fin reemplazar el consejo del médico. Asegúrese de hacerle al médico cualquier pregunta que tenga.     Document Released: 03/01/2005  Document Revised: 03/06/2013  Elsevier Interactive Patient Education ©2016 Elsevier Inc.

## 2015-03-29 NOTE — MAU Note (Signed)
Pt presents from [facility] urgent care for vaginal bleeding. Denies pain. 2 maxi pads put on before leaving urgent care on and saturated both.

## 2015-03-29 NOTE — MAU Provider Note (Signed)
History     CSN: [id number]  Arrival date and time: 03/29/15 2050   First Provider Initiated Contact with Patient 03/29/15 2214         Chief Complaint  Patient presents with  . Vaginal Bleeding   Renee Espinoza is a 40 y.o. female who was sent via [facility] from [facility]. Was seen there this evening for heavy vaginal bleeding x 3 days. Vital signs stable & hemoglobin was 12. Patient had syncopal episode in lobby & per MD note, pt was bleeding too much for pelvic exam so was sent here for further evaluation.   Female GU Problem The patient's primary symptoms include vaginal bleeding. This is a new problem. Episode onset: 3 days ago. The problem occurs constantly. The problem has been gradually worsening. The patient is experiencing no pain. She is not pregnant. Associated symptoms include headaches and nausea (while standing). Pertinent negatives include no abdominal pain, diarrhea, dysuria, fever, frequency, hematuria or vomiting. Associated symptoms comments: dizziness. The vaginal bleeding is heavier than menses. She has been passing clots. She has not been passing tissue. Nothing aggravates the symptoms. She has tried nothing for the symptoms. She is sexually active (last intercourse over 1 month ago). No, her partner does not have an STD. She uses nothing for contraception. Her menstrual history has been regular. There is no history of a Cesarean section, an ectopic pregnancy, endometriosis, a gynecological surgery, PID or an STD.    OB History    Gravida Para Term Preterm AB TAB SAB Ectopic Multiple Living   4 4 4       4       Past Medical History  Diagnosis Date  . Gallstones     Past Surgical History  Procedure Laterality Date  . Cholecystectomy N/A 07/05/2014    Procedure: LAPAROSCOPIC CHOLECYSTECTOMY WITH INTRAOPERATIVE CHOLANGIOGRAM;  Surgeon: Emelia Loron, MD;  Location: [facility] OR;  Service: General;  Laterality: N/A;    History reviewed. No pertinent family  history.  Social History  Substance Use Topics  . Smoking status: Never Smoker   . Smokeless tobacco: Never Used  . Alcohol Use: No    Allergies: No Known Allergies  Prescriptions prior to admission  Medication Sig Dispense Refill Last Dose  . acetaminophen (TYLENOL) 500 MG tablet Take 500 mg by mouth every 6 (six) hours as needed for mild pain.   07/03/2014 at Unknown time  . HYDROcodone-acetaminophen (NORCO/VICODIN) 5-325 MG per tablet Take 1 tablet by mouth every 4 (four) hours as needed. 15 tablet 0 07/03/2014 at Unknown time  . HYDROcodone-acetaminophen (NORCO/VICODIN) 5-325 MG per tablet Take 1 tablet by mouth every 4 (four) hours as needed for moderate pain. 30 tablet 0   . Multiple Vitamin (MULTIVITAMIN WITH MINERALS) TABS tablet Take 1 tablet by mouth daily.   Past Week at Unknown time  . ondansetron (ZOFRAN) 4 MG tablet Take 1 tablet (4 mg total) by mouth every 6 (six) hours. 12 tablet 0 07/03/2014 at Unknown time    Review of Systems  Constitutional: Negative for fever.  Respiratory: Positive for shortness of breath (immediately prior to syncopal episode. none otherwise).   Cardiovascular: Negative.   Gastrointestinal: Positive for nausea (while standing). Negative for vomiting, abdominal pain and diarrhea.  Genitourinary: Negative for dysuria, frequency and hematuria.       + vaginal bleeding  Neurological: Positive for dizziness, loss of consciousness, weakness and headaches.   Physical Exam   Blood pressure 112/66, pulse 114, temperature 98.6 F (37  C), temperature source Oral, resp. rate 18, last menstrual period 02/15/2015, SpO2 100 %, unknown if currently breastfeeding.  Orthostatic VS for the past 24 hrs:  BP- Lying Pulse- Lying BP- Sitting Pulse- Sitting BP- Standing at 0 minutes Pulse- Standing at 0 minutes  03/30/15 0046 97/62 mmHg 107 102/70 mmHg 119 - -  03/30/15 0004 110/70 mmHg 110 111/76 mmHg 122 (!) 88/42 mmHg 118       Physical Exam  Nursing note  and vitals reviewed. Constitutional: She is oriented to person, place, and time. She appears well-developed and well-nourished. No distress.  HENT:  Head: Normocephalic and atraumatic.  Eyes: Conjunctivae are normal. Right eye exhibits no discharge. Left eye exhibits no discharge. No scleral icterus.  Neck: Normal range of motion.  Cardiovascular: Regular rhythm and normal heart sounds.  Tachycardia present.   No murmur heard. Respiratory: Effort normal and breath sounds normal. No respiratory distress. She has no wheezes.  GI: Soft. Bowel sounds are normal. She exhibits no distension. There is no tenderness.  Genitourinary: Vagina normal.  Copious amount of vaginal bleeding made visualizing cervix very difficult. Dr. Debroah Loop called to bedside to assess bleeding. He was able to clear out several large clots & remove 1 cm size tissue from cervix   Neurological: She is alert and oriented to person, place, and time.  Skin: Skin is warm. She is diaphoretic.  Psychiatric: She has a normal mood and affect. Her behavior is normal. Judgment and thought content normal.    MAU Course  Procedures Results for orders placed or performed during the hospital encounter of 03/29/15 (from the past 24 hour(s))  CBC     Status: Abnormal   Collection Time: 03/29/15  9:10 PM  Result Value Ref Range   WBC 12.5 (H) 4.0 - 10.5 K/uL   RBC 3.53 (L) 3.87 - 5.11 MIL/uL   Hemoglobin 10.4 (L) 12.0 - 15.0 g/dL   HCT 21.3 (L) 08.6 - 57.8 %   MCV 89.0 78.0 - 100.0 fL   MCH 29.5 26.0 - 34.0 pg   MCHC 33.1 30.0 - 36.0 g/dL   RDW 46.9 62.9 - 52.8 %   Platelets 237 150 - 400 K/uL  Comprehensive metabolic panel     Status: Abnormal   Collection Time: 03/29/15  9:10 PM  Result Value Ref Range   Sodium 139 135 - 145 mmol/L   Potassium 3.7 3.5 - 5.1 mmol/L   Chloride 107 101 - 111 mmol/L   CO2 22 22 - 32 mmol/L   Glucose, Bld 128 (H) 65 - 99 mg/dL   BUN 10 6 - 20 mg/dL   Creatinine, Ser 4.13 0.44 - 1.00 mg/dL    Calcium 8.1 (L) 8.9 - 10.3 mg/dL   Total Protein 6.2 (L) 6.5 - 8.1 g/dL   Albumin 3.5 3.5 - 5.0 g/dL   AST 25 15 - 41 U/L   ALT 20 14 - 54 U/L   Alkaline Phosphatase 60 38 - 126 U/L   Total Bilirubin 1.0 0.3 - 1.2 mg/dL   GFR calc non Af Amer >60 >60 mL/min   GFR calc Af Amer >60 >60 mL/min   Anion gap 10 5 - 15  hCG, quantitative, pregnancy     Status: None   Collection Time: 03/29/15  9:10 PM  Result Value Ref Range   hCG, Beta Chain, Quant, S <1 <5 mIU/mL  Type and screen     Status: None   Collection Time: 03/29/15  9:10 PM  Result  Value Ref Range   ABO/RH(D) A POS    Antibody Screen NEG    Sample Expiration 04/01/2015   CBC     Status: Abnormal   Collection Time: 03/29/15 11:05 PM  Result Value Ref Range   WBC 11.4 (H) 4.0 - 10.5 K/uL   RBC 3.27 (L) 3.87 - 5.11 MIL/uL   Hemoglobin 9.7 (L) 12.0 - 15.0 g/dL   HCT 16.129.2 (L) 09.636.0 - 04.546.0 %   MCV 89.3 78.0 - 100.0 fL   MCH 29.7 26.0 - 34.0 pg   MCHC 33.2 30.0 - 36.0 g/dL   RDW 40.912.8 81.111.5 - 91.415.5 %   Platelets 250 150 - 400 K/uL  CBC     Status: Abnormal   Collection Time: 03/30/15 12:15 AM  Result Value Ref Range   WBC 10.5 4.0 - 10.5 K/uL   RBC 3.19 (L) 3.87 - 5.11 MIL/uL   Hemoglobin 9.4 (L) 12.0 - 15.0 g/dL   HCT 78.228.5 (L) 95.636.0 - 21.346.0 %   MCV 89.3 78.0 - 100.0 fL   MCH 29.5 26.0 - 34.0 pg   MCHC 33.0 30.0 - 36.0 g/dL   RDW 08.612.7 57.811.5 - 46.915.5 %   Platelets 263 150 - 400 K/uL  Glucose, capillary     Status: Abnormal   Collection Time: 03/30/15  1:01 AM  Result Value Ref Range   Glucose-Capillary 119 (H) 65 - 99 mg/dL    MDM BHCG negative Tissue removed from cervix sent to pathology.  Hemoglobin stable Patient up to bathroom with assistance. Bleeding decreased significantly. Plan to discharge home with outpatient f/u ultrasound & clinic appt.   Upon discharging patient, patient became nauseated & dizzy upon standing. Found to be orthostatic & symptomatic. CBC repeated and hemoglobin remains stable. Bleeding has not  increased. CBG 119. Orthostatic VS repeated & pt unable to tolerate standing position.  S/w Dr. Debroah LoopArnold - [facility]   Assessment and Plan  A: 1. Abnormal uterine bleeding (AUB)   2. Symptomatic anemia    P: Admit to women's unit for observation IV fluids & regular diet  Judeth HornErin Airam Heidecker, NP  03/29/2015, 9:32 PM

## 2015-03-29 NOTE — ED Notes (Signed)
Family at bedside. 

## 2015-03-29 NOTE — ED Notes (Signed)
Patient received 1000 cc bolus. IV bag changed and [facility] per verbal order from Dr. Lum BabeEniola. Family at bedside.

## 2015-03-29 NOTE — ED Provider Notes (Signed)
CSN: [id number]     Arrival date & time 03/29/15  1859 History   None    Chief Complaint  Patient presents with  . Vaginal Bleeding   (Consider location/radiation/quality/duration/timing/severity/associated sxs/prior Treatment) The history is provided by the patient. The history is limited by a language barrier. A language interpreter was used.  vaginal bleeding: brought in by her relative for 4 days hx of heavy vaginal bleeding which worsened today. She changes her sanitary towel multiple times during the day and night. She has been passing heavy clots. She has mild abdominal cramping. Today she started feeling dizzy and diaphoretic.   Past Medical History  Diagnosis Date  . Gallstones    Past Surgical History  Procedure Laterality Date  . Cholecystectomy N/A 07/05/2014    Procedure: LAPAROSCOPIC CHOLECYSTECTOMY WITH INTRAOPERATIVE CHOLANGIOGRAM;  Surgeon: Emelia Loron, MD;  Location: [facility] OR;  Service: General;  Laterality: N/A;   No family history on file. Social History  Substance Use Topics  . Smoking status: Never Smoker   . Smokeless tobacco: Never Used  . Alcohol Use: No   OB History    Gravida Para Term Preterm AB TAB SAB Ectopic Multiple Living   4 4 4       4      Review of Systems  Respiratory: Negative.   Cardiovascular: Negative.   Gastrointestinal: Positive for abdominal pain. Negative for constipation and blood in stool.  Genitourinary: Positive for vaginal bleeding.  All other systems reviewed and are negative.   Allergies  Review of patient's allergies indicates no known allergies.  Home Medications   Prior to Admission medications   Medication Sig Start Date End Date Taking? Authorizing Provider  acetaminophen (TYLENOL) 500 MG tablet Take 500 mg by mouth every 6 (six) hours as needed for mild pain.    Historical Provider, MD  HYDROcodone-acetaminophen (NORCO/VICODIN) 5-325 MG per tablet Take 1 tablet by mouth every 4 (four) hours as needed. 07/02/14    Oswaldo Conroy, PA-C  HYDROcodone-acetaminophen (NORCO/VICODIN) 5-325 MG per tablet Take 1 tablet by mouth every 4 (four) hours as needed for moderate pain. 07/06/14   Claud Kelp, MD  Multiple Vitamin (MULTIVITAMIN WITH MINERALS) TABS tablet Take 1 tablet by mouth daily.    Historical Provider, MD  ondansetron (ZOFRAN) 4 MG tablet Take 1 tablet (4 mg total) by mouth every 6 (six) hours. 07/02/14   Oswaldo Conroy, PA-C   Meds Ordered and Administered this Visit  Medications - No data to display  BP 118/84 mmHg  Pulse 104  Resp 16  SpO2 99%  LMP 02/15/2015 No data found.   Physical Exam  Constitutional: She appears well-developed. No distress.  Cardiovascular: Normal rate, regular rhythm and normal heart sounds.   No murmur heard. Pulmonary/Chest: Effort normal and breath sounds normal. No respiratory distress. She has no wheezes.  Abdominal: Bowel sounds are normal. She exhibits no distension and no mass. There is no tenderness.  Genitourinary: There is no lesion on the right labia. There is no lesion on the left labia. Cervix exhibits no motion tenderness. Right adnexum displays no tenderness. Left adnexum displays no tenderness. There is bleeding in the vagina.    Nursing note and vitals reviewed.   ED Course  Procedures (including critical care time)  Labs Review Labs Reviewed  GLUCOSE, CAPILLARY - Abnormal; Notable for the following:    Glucose-Capillary 158 (*)    All other components within normal limits  POCT I-STAT, CHEM 8 - Abnormal; Notable for the following:  Potassium 3.3 (*)    Glucose, Bld 165 (*)    All other components within normal limits    Imaging Review No results found.   Visual Acuity Review  Right Eye Distance:   Left Eye Distance:   Bilateral Distance:    Right Eye Near:   Left Eye Near:    Bilateral Near:           MDM  No diagnosis found. Menorrhagia with regular cycle  Hypokalemia  Patient's vital signs is within  normal range. Istat was done with hgb of 12 which is reassuring. Since patient is symptomatic and still bleeding heavily, I recommended [facility]. I contacted MAU and spoke with Judeth HornErin Lawrence who agreed to accept patient. 1 L NS given prior to Transferring to MAU.  Patient to follow up with her PCP for mild hypokalemia as well as Hyperglycemia. She will need repeat lab.    Doreene ElandKehinde T Kesia Dalto, MD 03/29/15 2039

## 2015-03-29 NOTE — ED Notes (Signed)
[facility] in stable condition  1 Liter NS infused with second bag KVO Pt is feeling better, color has improved BP improved post fluids 118/84

## 2015-03-29 NOTE — Discharge Instructions (Signed)
Menorrhagia Menorrhagia is when your menstrual periods are heavy or last longer than usual.  HOME CARE  Only take medicine as told by your doctor.  Take any iron pills as told by your doctor. Heavy bleeding may cause low levels of iron in your body.  Do not take aspirin 1 week before or during your period. Aspirin can make the bleeding worse.  Lie down for a while if you change your tampon or pad more than once in 2 hours. This may help lessen the bleeding.  Eat a healthy diet and foods with iron. These foods include leafy green vegetables, meat, liver, eggs, and whole grain breads and cereals.  Do not try to lose weight. Wait until the heavy bleeding has stopped and your iron level is normal. GET HELP IF:  You soak through a pad or tampon every 1 or 2 hours, and this happens every time you have a period.  You need to use pads and tampons at the same time because you are bleeding so much.  You need to change your pad or tampon during the night.  You have a period that lasts for more than 8 days.  You pass clots bigger than 1 inch (2.5 cm) wide.  You have irregular periods that happen more or less often than once a month.  You feel dizzy or pass out (faint).  You feel very weak or tired.  You feel short of breath or feel your heart is beating too fast when you exercise.  You feel sick to your stomach (nausea) and you throw up (vomit) while you are taking your medicine.   You have watery poop (diarrhea) while you are taking your medicine.  You have any problems that may be related to the medicine you are taking.  GET HELP RIGHT AWAY IF:  You soak through 4 or more pads or tampons in 2 hours.  You have any bleeding while you are pregnant. MAKE SURE YOU:   Understand these instructions.  Will watch your condition.  Will get help right away if you are not doing well or get worse.   This information is not intended to replace advice given to you by your health care  provider. Make sure you discuss any questions you have with your health care provider.   Document Released: 12/09/2007 Document Revised: 11/01/2012 Document Reviewed: 08/31/2012 Elsevier Interactive Patient Education 2016 Elsevier Inc.  

## 2015-03-29 NOTE — ED Notes (Signed)
Pt brought back urgent to room 10 for increased dizziness, weakness r/t vaginal bleeding x 3 days According to friend per interpretor, no medical treatment done Bleeding worsened by Thursday Diaphoretic, denies pain, slight bleeding with new pad Tried taking Tylenol for relief Vital signs- 109/69 111 100RA CBG- 158 Hg 12.2 Care link called

## 2015-03-29 NOTE — ED Notes (Signed)
Patient placed on O2 at 2 lpm via [place] 

## 2015-03-30 DIAGNOSIS — Z3202 Encounter for pregnancy test, result negative: Secondary | ICD-10-CM

## 2015-03-30 DIAGNOSIS — R61 Generalized hyperhidrosis: Secondary | ICD-10-CM

## 2015-03-30 DIAGNOSIS — Z8719 Personal history of other diseases of the digestive system: Secondary | ICD-10-CM

## 2015-03-30 DIAGNOSIS — R110 Nausea: Secondary | ICD-10-CM | POA: Diagnosis not present

## 2015-03-30 DIAGNOSIS — R51 Headache: Secondary | ICD-10-CM | POA: Diagnosis not present

## 2015-03-30 DIAGNOSIS — N939 Abnormal uterine and vaginal bleeding, unspecified: Secondary | ICD-10-CM | POA: Diagnosis not present

## 2015-03-30 DIAGNOSIS — D649 Anemia, unspecified: Secondary | ICD-10-CM | POA: Diagnosis not present

## 2015-03-30 DIAGNOSIS — R0602 Shortness of breath: Secondary | ICD-10-CM

## 2015-03-30 LAB — CBC
HCT: 24.2 % — ABNORMAL LOW (ref 36.0–46.0)
HCT: 28.5 % — ABNORMAL LOW (ref 36.0–46.0)
HEMATOCRIT: 22.7 % — AB (ref 36.0–46.0)
HEMOGLOBIN: 8 g/dL — AB (ref 12.0–15.0)
Hemoglobin: 7.7 g/dL — ABNORMAL LOW (ref 12.0–15.0)
Hemoglobin: 9.4 g/dL — ABNORMAL LOW (ref 12.0–15.0)
MCH: 29.5 pg (ref 26.0–34.0)
MCH: 29.7 pg (ref 26.0–34.0)
MCH: 30.3 pg (ref 26.0–34.0)
MCHC: 33 g/dL (ref 30.0–36.0)
MCHC: 33.1 g/dL (ref 30.0–36.0)
MCHC: 33.9 g/dL (ref 30.0–36.0)
MCV: 89.3 fL (ref 78.0–100.0)
MCV: 89.4 fL (ref 78.0–100.0)
MCV: 90 fL (ref 78.0–100.0)
PLATELETS: 263 10*3/uL (ref 150–400)
Platelets: 223 10*3/uL (ref 150–400)
Platelets: 226 10*3/uL (ref 150–400)
RBC: 2.54 MIL/uL — ABNORMAL LOW (ref 3.87–5.11)
RBC: 2.69 MIL/uL — AB (ref 3.87–5.11)
RBC: 3.19 MIL/uL — ABNORMAL LOW (ref 3.87–5.11)
RDW: 12.7 % (ref 11.5–15.5)
RDW: 12.9 % (ref 11.5–15.5)
RDW: 13.1 % (ref 11.5–15.5)
WBC: 10.5 10*3/uL (ref 4.0–10.5)
WBC: 8.8 10*3/uL (ref 4.0–10.5)
WBC: 8.9 10*3/uL (ref 4.0–10.5)

## 2015-03-30 LAB — GLUCOSE, CAPILLARY: Glucose-Capillary: 119 mg/dL — ABNORMAL HIGH (ref 65–99)

## 2015-03-30 MED ORDER — LACTATED RINGERS IV SOLN
INTRAVENOUS | Status: DC
  Administered 2015-03-30: 03:00:00 via INTRAVENOUS

## 2015-03-30 MED ORDER — INFLUENZA VAC SPLIT QUAD 0.5 ML IM SUSY: 0.5000 mL | PREFILLED_SYRINGE | INTRAMUSCULAR | Status: DC

## 2015-03-30 MED ORDER — PROMETHAZINE HCL 25 MG/ML IJ SOLN
25.0000 mg | Freq: Four times a day (QID) | INTRAMUSCULAR | Status: DC | PRN
  Administered 2015-03-30: 25 mg via INTRAVENOUS
  Filled 2015-03-30: qty 1

## 2015-03-30 MED ORDER — MEGESTROL ACETATE 40 MG PO TABS: ORAL_TABLET | ORAL | Status: AC

## 2015-03-30 NOTE — MAU Note (Signed)
[facility] Will go to 304 after IV start

## 2015-03-30 NOTE — Discharge Summary (Signed)
Physician Discharge Summary  Patient ID: Donnal DebarClaudia Sayegh MRN: [id number] DOB/AGE: 39/05/1975 39 y.o.  Admit date: 03/29/2015  \ Discharge date: 03/30/2015  Admission Diagnoses: Menometrorrhagia Anemia  Discharge Diagnoses:  Active Problems:   Abnormal uterine bleeding (AUB)   Discharged Condition: good  Hospital Course: admitted for premarin and megestrol therapy and observaiton of bleeding with serial bolld counts.  She stopped bleeding stabilized her hemoglobin and was discharge on megace 120 mg daily to follow up in clinic in 2 weeks for on going management options to be discussed  She is also to take iron supplements as well  Consults: None  Significant Diagnostic Studies:   Treatments: observation, premarin and megestrol  Discharge Exam: Blood pressure 115/67, pulse 100, temperature 98.2 F (36.8 C), temperature source Oral, resp. rate 18, height 5\' 6"  (1.676 m), weight 221 lb 2 oz (100.302 kg), last menstrual period 02/15/2015, SpO2 100 %, unknown if currently breastfeeding. General appearance: alert, cooperative and no distress GI: soft, non-tender; bowel sounds normal; no masses,  no organomegaly  Disposition: 69-Discharged/transferred to a [facility]  Discharge Instructions    Diet - low sodium heart healthy    Complete by:  As directed      Discharge patient    Complete by:  As directed      Increase activity slowly    Complete by:  As directed             Medication List    TAKE these medications        acetaminophen 500 MG tablet  Commonly known as:  TYLENOL  Take 500 mg by mouth every 6 (six) hours as needed for mild pain or headache.     megestrol 20 MG tablet  Commonly known as:  MEGACE  Take 2 tablets (40 mg total) by mouth 2 (two) times daily.     megestrol 40 MG tablet  Commonly known as:  MEGACE  Take 3 tablets daily           Follow-up Information    [facility]    Specialty:  Obstetrics and Gynecology   Why:  Will call you to schedule appointment   Contact information:   [address] [place] [phone] [phone]      [facility] In [facility] weeks.   Why:  Gyn clinic   Contact information:   [address] [place] [phone]      Signed: Lazaro ArmsURE,LUTHER H 03/30/2015, 3:40 PM

## 2015-03-30 NOTE — Progress Notes (Signed)

## 2015-03-30 NOTE — MAU Note (Signed)
Attempted orthostatic vital signs again. Pt unable to stand for BP. Felt faint and had shortness of breath. Unable to take BP standing. Pt laid down and felt nauseous. NP notified

## 2015-03-30 NOTE — H&P (Signed)
History     CSN: [id number]  Arrival date and time: 03/29/15 2050  First Provider Initiated Contact with Patient 03/29/15 2214      Chief Complaint  Patient presents with  . Vaginal Bleeding   Renee Espinoza is a 40 y.o. female who was sent via [facility] from [facility]. Was seen there this evening for heavy vaginal bleeding x 3 days. Vital signs stable & hemoglobin was 12. Patient had syncopal episode in lobby & per MD note, pt was bleeding too much for pelvic exam so was sent here for further evaluation.   Female GU Problem The patient's primary symptoms include vaginal bleeding. This is a new problem. Episode onset: 3 days ago. The problem occurs constantly. The problem has been gradually worsening. The patient is experiencing no pain. She is not pregnant. Associated symptoms include headaches and nausea (while standing). Pertinent negatives include no abdominal pain, diarrhea, dysuria, fever, frequency, hematuria or vomiting. Associated symptoms comments: dizziness. The vaginal bleeding is heavier than menses. She has been passing clots. She has not been passing tissue. Nothing aggravates the symptoms. She has tried nothing for the symptoms. She is sexually active (last intercourse over 1 month ago). No, her partner does not have an STD. She uses nothing for contraception. Her menstrual history has been regular. There is no history of a Cesarean section, an ectopic pregnancy, endometriosis, a gynecological surgery, PID or an STD.    OB History    Gravida Para Term Preterm AB TAB SAB Ectopic Multiple Living   4 4 4       4       Past Medical History  Diagnosis Date  . Gallstones     Past Surgical History  Procedure Laterality Date  . Cholecystectomy N/A 07/05/2014    Procedure: LAPAROSCOPIC CHOLECYSTECTOMY WITH INTRAOPERATIVE CHOLANGIOGRAM; Surgeon: Emelia Loron, MD; Location: [facility] OR; Service: General;  Laterality: N/A;    History reviewed. No pertinent family history.  Social History  Substance Use Topics  . Smoking status: Never Smoker   . Smokeless tobacco: Never Used  . Alcohol Use: No    Allergies: No Known Allergies  Prescriptions prior to admission  Medication Sig Dispense Refill Last Dose  . acetaminophen (TYLENOL) 500 MG tablet Take 500 mg by mouth every 6 (six) hours as needed for mild pain.   07/03/2014 at Unknown time  . HYDROcodone-acetaminophen (NORCO/VICODIN) 5-325 MG per tablet Take 1 tablet by mouth every 4 (four) hours as needed. 15 tablet 0 07/03/2014 at Unknown time  . HYDROcodone-acetaminophen (NORCO/VICODIN) 5-325 MG per tablet Take 1 tablet by mouth every 4 (four) hours as needed for moderate pain. 30 tablet 0   . Multiple Vitamin (MULTIVITAMIN WITH MINERALS) TABS tablet Take 1 tablet by mouth daily.   Past Week at Unknown time  . ondansetron (ZOFRAN) 4 MG tablet Take 1 tablet (4 mg total) by mouth every 6 (six) hours. 12 tablet 0 07/03/2014 at Unknown time    Review of Systems  Constitutional: Negative for fever.  Respiratory: Positive for shortness of breath (immediately prior to syncopal episode. none otherwise).  Cardiovascular: Negative.  Gastrointestinal: Positive for nausea (while standing). Negative for vomiting, abdominal pain and diarrhea.  Genitourinary: Negative for dysuria, frequency and hematuria.   + vaginal bleeding  Neurological: Positive for dizziness, loss of consciousness, weakness and headaches.   Physical Exam   Blood pressure 112/66, pulse 114, temperature 98.6 F (37 C), temperature source Oral, resp. rate 18, last menstrual period 02/15/2015, SpO2 100 %,  unknown if currently breastfeeding.  Orthostatic VS for the past 24 hrs:  BP- Lying Pulse- Lying BP- Sitting Pulse- Sitting BP- Standing at 0 minutes Pulse- Standing at 0 minutes  03/30/15 0046 97/62 mmHg 107  102/70 mmHg 119 - -  03/30/15 0004 110/70 mmHg 110 111/76 mmHg 122 (!) 88/42 mmHg 118       Physical Exam  Nursing note and vitals reviewed. Constitutional: She is oriented to person, place, and time. She appears well-developed and well-nourished. No distress.  HENT:  Head: Normocephalic and atraumatic.  Eyes: Conjunctivae are normal. Right eye exhibits no discharge. Left eye exhibits no discharge. No scleral icterus.  Neck: Normal range of motion.  Cardiovascular: Regular rhythm and normal heart sounds. Tachycardia present.  No murmur heard. Respiratory: Effort normal and breath sounds normal. No respiratory distress. She has no wheezes.  GI: Soft. Bowel sounds are normal. She exhibits no distension. There is no tenderness.  Genitourinary: Vagina normal.  Copious amount of vaginal bleeding made visualizing cervix very difficult. Dr. Debroah Loop called to bedside to assess bleeding. He was able to clear out several large clots & remove 1 cm size tissue from cervix  Neurological: She is alert and oriented to person, place, and time.  Skin: Skin is warm. She is diaphoretic.  Psychiatric: She has a normal mood and affect. Her behavior is normal. Judgment and thought content normal.    MAU Course  Procedures  Lab Results Last 24 Hours    Results for orders placed or performed during the hospital encounter of 03/29/15 (from the past 24 hour(s))  CBC Status: Abnormal   Collection Time: 03/29/15 9:10 PM  Result Value Ref Range   WBC 12.5 (H) 4.0 - 10.5 K/uL   RBC 3.53 (L) 3.87 - 5.11 MIL/uL   Hemoglobin 10.4 (L) 12.0 - 15.0 g/dL   HCT 40.9 (L) 81.1 - 91.4 %   MCV 89.0 78.0 - 100.0 fL   MCH 29.5 26.0 - 34.0 pg   MCHC 33.1 30.0 - 36.0 g/dL   RDW 78.2 95.6 - 21.3 %   Platelets 237 150 - 400 K/uL  Comprehensive metabolic panel Status: Abnormal   Collection Time: 03/29/15 9:10 PM  Result Value Ref Range    Sodium 139 135 - 145 mmol/L   Potassium 3.7 3.5 - 5.1 mmol/L   Chloride 107 101 - 111 mmol/L   CO2 22 22 - 32 mmol/L   Glucose, Bld 128 (H) 65 - 99 mg/dL   BUN 10 6 - 20 mg/dL   Creatinine, Ser 0.86 0.44 - 1.00 mg/dL   Calcium 8.1 (L) 8.9 - 10.3 mg/dL   Total Protein 6.2 (L) 6.5 - 8.1 g/dL   Albumin 3.5 3.5 - 5.0 g/dL   AST 25 15 - 41 U/L   ALT 20 14 - 54 U/L   Alkaline Phosphatase 60 38 - 126 U/L   Total Bilirubin 1.0 0.3 - 1.2 mg/dL   GFR calc non Af Amer >60 >60 mL/min   GFR calc Af Amer >60 >60 mL/min   Anion gap 10 5 - 15  hCG, quantitative, pregnancy Status: None   Collection Time: 03/29/15 9:10 PM  Result Value Ref Range   hCG, Beta Chain, Quant, S <1 <5 mIU/mL  Type and screen Status: None   Collection Time: 03/29/15 9:10 PM  Result Value Ref Range   ABO/RH(D) A POS    Antibody Screen NEG    Sample Expiration 04/01/2015   CBC Status: Abnormal   Collection  Time: 03/29/15 11:05 PM  Result Value Ref Range   WBC 11.4 (H) 4.0 - 10.5 K/uL   RBC 3.27 (L) 3.87 - 5.11 MIL/uL   Hemoglobin 9.7 (L) 12.0 - 15.0 g/dL   HCT 91.429.2 (L) 78.236.0 - 95.646.0 %   MCV 89.3 78.0 - 100.0 fL   MCH 29.7 26.0 - 34.0 pg   MCHC 33.2 30.0 - 36.0 g/dL   RDW 21.312.8 08.611.5 - 57.815.5 %   Platelets 250 150 - 400 K/uL  CBC Status: Abnormal   Collection Time: 03/30/15 12:15 AM  Result Value Ref Range   WBC 10.5 4.0 - 10.5 K/uL   RBC 3.19 (L) 3.87 - 5.11 MIL/uL   Hemoglobin 9.4 (L) 12.0 - 15.0 g/dL   HCT 46.928.5 (L) 62.936.0 - 52.846.0 %   MCV 89.3 78.0 - 100.0 fL   MCH 29.5 26.0 - 34.0 pg   MCHC 33.0 30.0 - 36.0 g/dL   RDW 41.312.7 24.411.5 - 01.015.5 %   Platelets 263 150 - 400 K/uL  Glucose, capillary Status: Abnormal   Collection Time: 03/30/15 1:01 AM  Result Value Ref Range   Glucose-Capillary 119 (H) 65 - 99 mg/dL       MDM BHCG negative Tissue removed from cervix sent to pathology.  Hemoglobin stable Patient up to bathroom with assistance. Bleeding decreased significantly. Plan to discharge home with outpatient f/u ultrasound & clinic appt.   Upon discharging patient, patient became nauseated & dizzy upon standing. Found to be orthostatic & symptomatic. CBC repeated and hemoglobin remains stable. Bleeding has not increased. CBG 119. Orthostatic VS repeated & pt unable to tolerate standing position.  S/w Dr. Debroah LoopArnold [facility]   Assessment and Plan  A: 1. Abnormal uterine bleeding (AUB)   2. Symptomatic anemia    P: Admit to women's unit for observation IV fluids & regular diet  Judeth HornErin Lawrence, NP 03/29/2015, 9:32 PM        Attestation of Attending Supervision of Advanced Practitioner (CNM/NP/PA): Evaluation and management procedures were performed by the Advanced Practitioner under my supervision and collaboration. I have reviewed the Advanced Practitioner's note and chart, and I agree with the management and plan.  Scheryl DarterJames Arnold MD

## 2015-03-30 NOTE — MAU Note (Signed)
Pt family member called out saying pt felt faint and like she was going to throw up. Pt diaphoretic and dry heaving. NP notified

## 2015-03-30 NOTE — Plan of Care (Signed)
Problem: Education: Goal: Knowledge of [place] [place] [facility] Education information/materials will improve Outcome: Completed/Met Date Met:  03/30/15 With assistance from [place] interpreter.

## 2015-04-01 ENCOUNTER — Encounter (HOSPITAL_COMMUNITY): Payer: Self-pay | Admitting: Student

## 2015-04-02 ENCOUNTER — Other Ambulatory Visit: Payer: Self-pay | Admitting: Student

## 2015-04-02 DIAGNOSIS — N939 Abnormal uterine and vaginal bleeding, unspecified: Secondary | ICD-10-CM

## 2015-04-08 ENCOUNTER — Ambulatory Visit (HOSPITAL_COMMUNITY)
Admission: RE | Admit: 2015-04-08 | Discharge: 2015-04-08 | Disposition: A | Discharge status: Home / Self Care | Payer: Medicaid Other | Source: Ambulatory Visit | Attending: Student | Admitting: Student

## 2015-04-08 DIAGNOSIS — N939 Abnormal uterine and vaginal bleeding, unspecified: Secondary | ICD-10-CM | POA: Diagnosis present

## 2015-04-11 ENCOUNTER — Telehealth: Payer: Self-pay | Admitting: *Deleted

## 2015-04-11 NOTE — Telephone Encounter (Signed)
Called pt with interpreter Amanda Pea. She was advised that her biopsy shows a benign polyp and she does not needs follow up. Pt stated that she has appt on 1/30 and asked if it was necessary. She further stated that she ahs been feeling better since admission to the hospital, she has taken all of the prescribed medication and her bleeding has stopped. I responded that pt does not require the appt on 1/30. She may call back if she has future problems. Pt agreed and voiced understanding.

## 2015-04-14 ENCOUNTER — Encounter: Payer: Self-pay | Admitting: Obstetrics & Gynecology

## 2017-11-11 IMAGING — US US PELVIS COMPLETE
1 series · 15 of 25 positions shown · non-contrast
Comparison: 04/08/2012.

CLINICAL DATA: Abnormal uterine bleeding.

EXAM:
TRANSABDOMINAL AND TRANSVAGINAL ULTRASOUND OF PELVIS
TECHNIQUE: Both transabdominal and transvaginal ultrasound examinations of the
pelvis were performed. Transabdominal technique was performed for
global imaging of the pelvis including uterus, ovaries, adnexal
regions, and pelvic cul-de-sac. It was necessary to proceed with
endovaginal exam following the transabdominal exam to visualize the
endometrium and right ovary.

[Series 1: us pelvis complete · 15 of 54 slices shown]
[im 1/54]
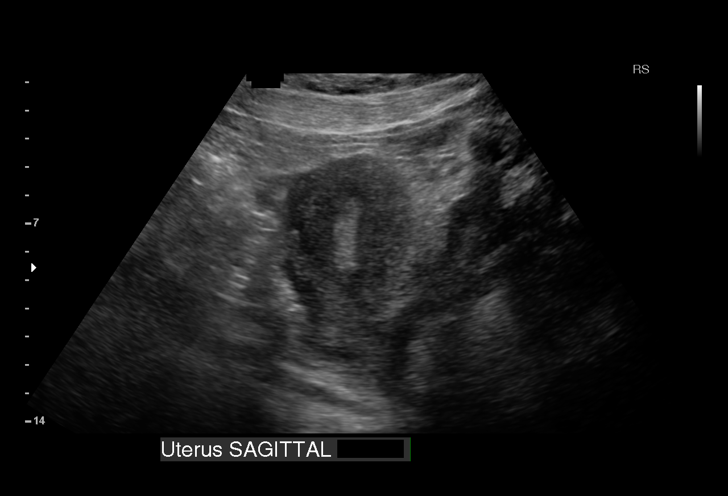
[im 5/54]
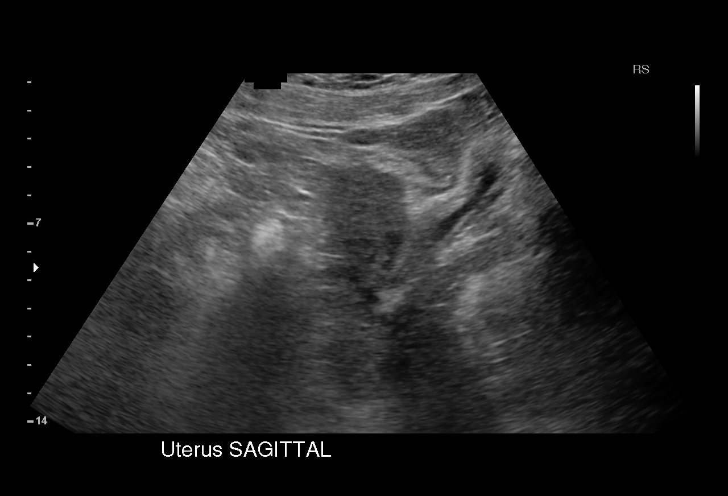
[im 9/54]
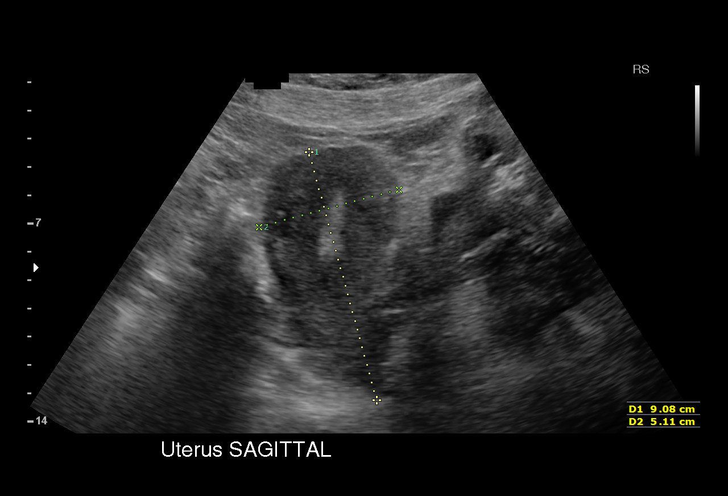
[im 12/54]
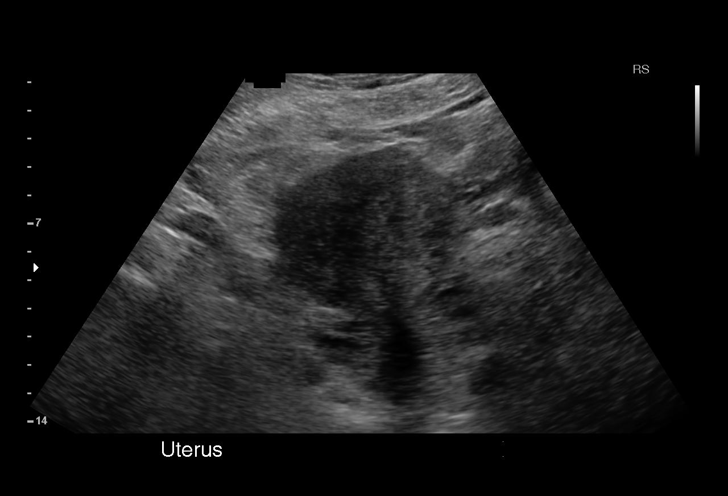
[im 16/54]
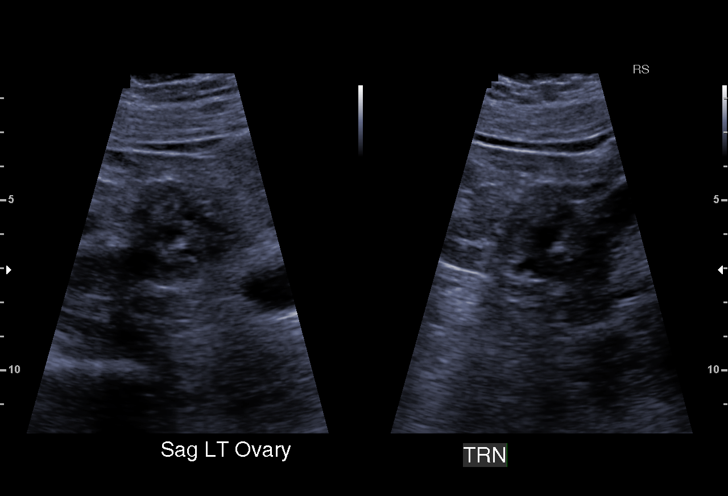
[im 20/54]
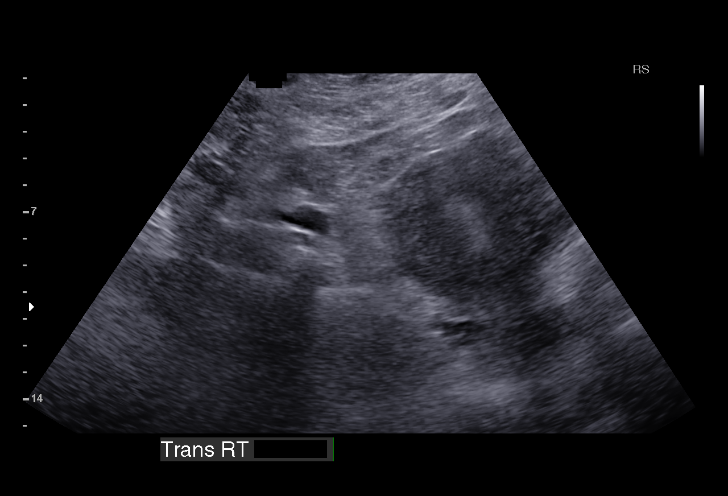
[im 23/54]
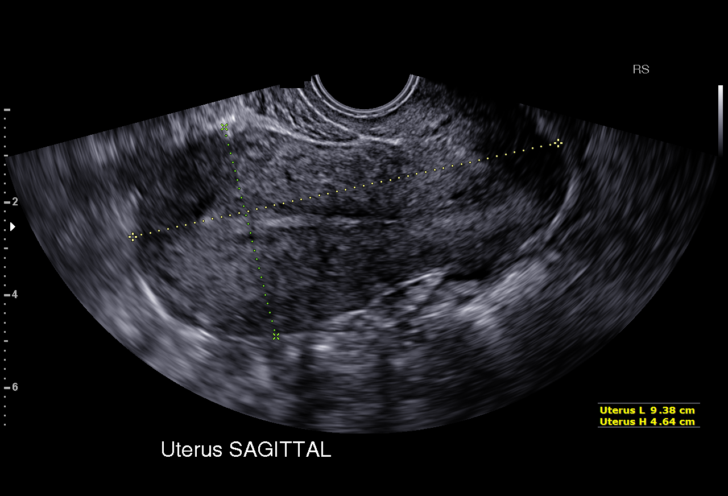
[im 27/54]
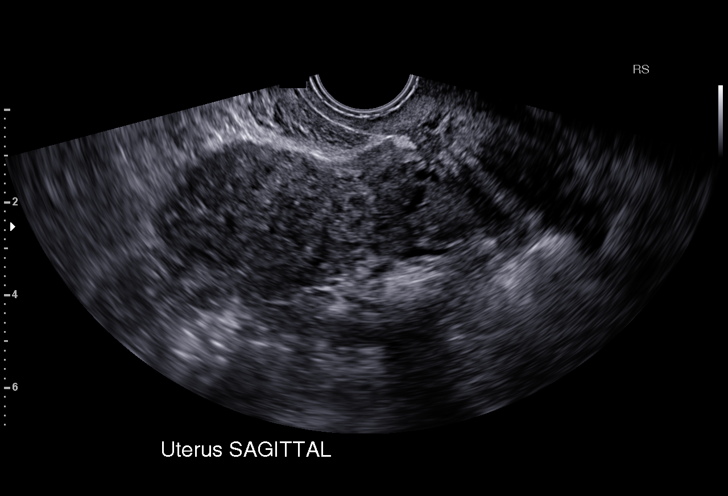
[im 31/54]
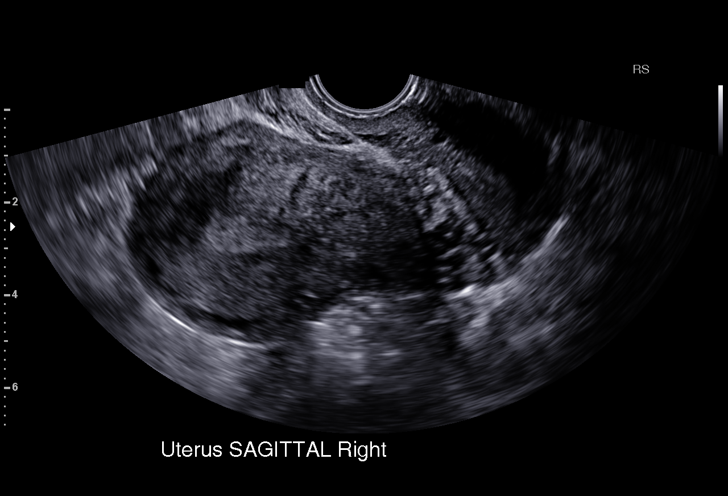
[im 34/54]
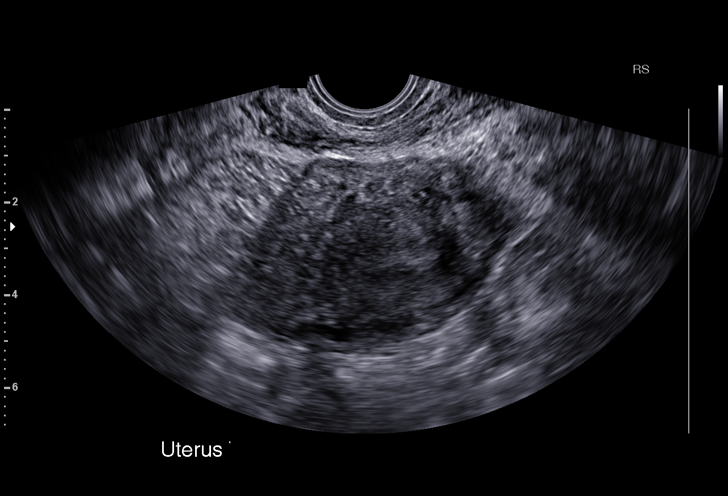
[im 38/54]
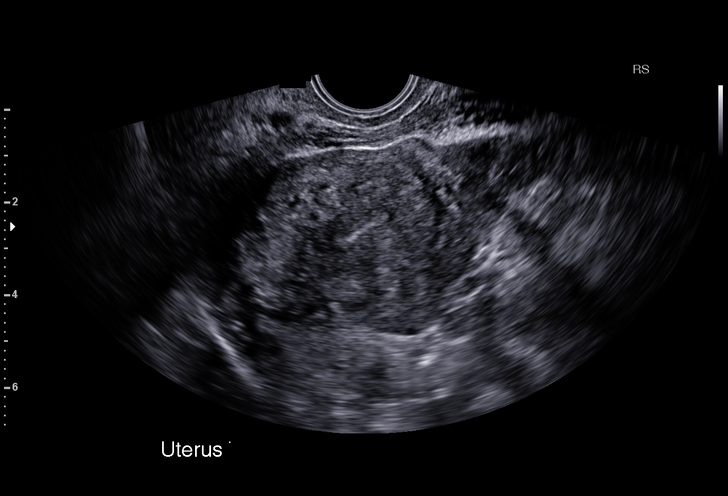
[im 42/54]
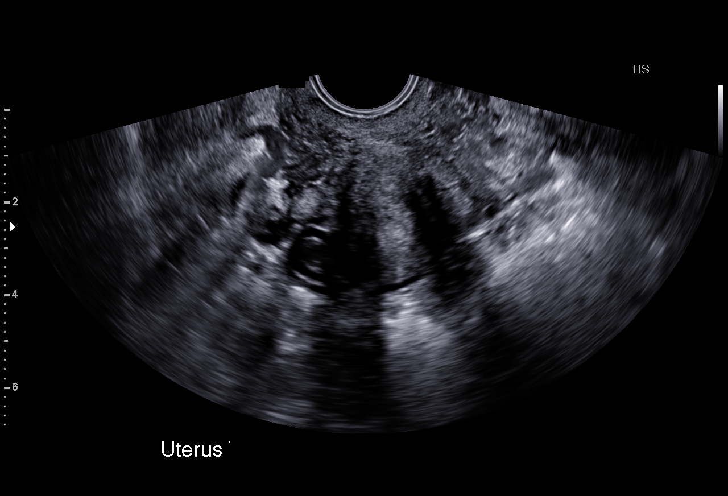
[im 45/54]
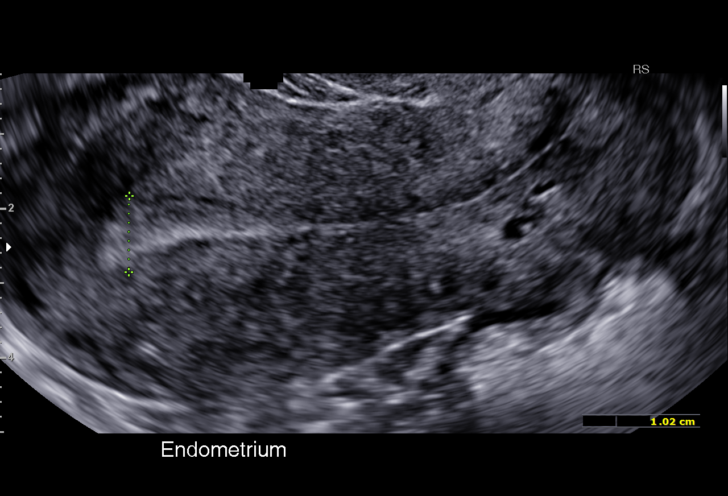
[im 49/54]
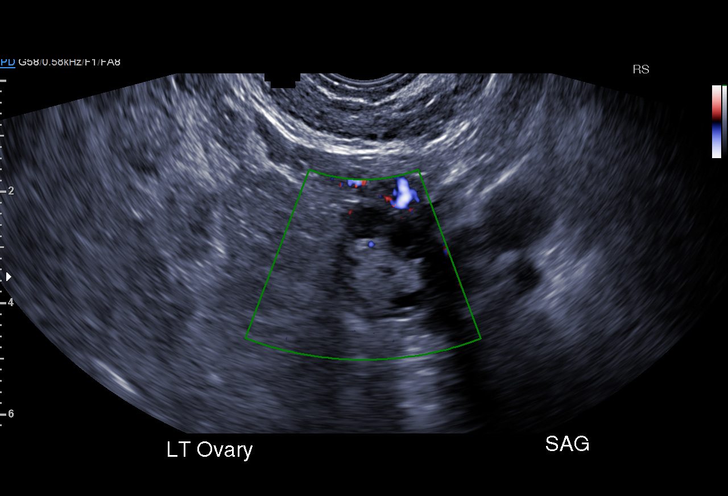
[im 54/54]
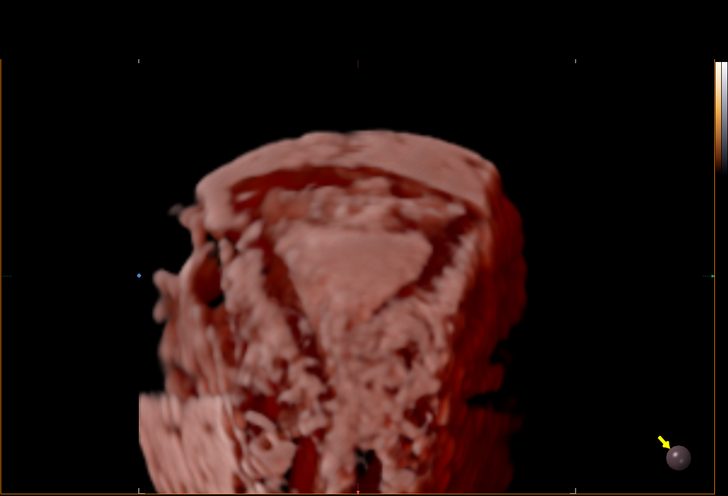

[15 of 25 positions shown; findings below may reference images not displayed]

FINDINGS: Uterus

Measurements: 9.4 x 6.2 x 4.6 cm. The myometrium is mildly diffusely
heterogeneous. No fibroids or other mass visualized.

Endometrium

Thickness: 10.2 mm.  No focal abnormality visualized.

Right ovary

Measurements: 3.1 x 2.4 x 1.4 cm. Normal appearance/no adnexal mass.

Left ovary

Measurements: 2.3 x 1.9 x 1.7 cm. Normal appearance/no adnexal mass.

Other findings

No abnormal free fluid.
IMPRESSION: 1. Mild diffuse myometrial inhomogeneity with no visible mass.
2. Normal appearing endometrium.

## 2021-02-02 ENCOUNTER — Other Ambulatory Visit: Payer: Self-pay

## 2021-02-02 ENCOUNTER — Other Ambulatory Visit: Payer: Self-pay | Admitting: Nurse Practitioner

## 2021-02-02 ENCOUNTER — Ambulatory Visit
Admission: RE | Admit: 2021-02-02 | Discharge: 2021-02-02 | Disposition: A | Discharge status: Home / Self Care | Payer: No Typology Code available for payment source | Source: Ambulatory Visit | Attending: Nurse Practitioner | Admitting: Nurse Practitioner

## 2021-02-02 DIAGNOSIS — R059 Cough, unspecified: Secondary | ICD-10-CM
